# Patient Record
Sex: Female | Born: 1985 | Race: White | Hispanic: No | Marital: Single | Smoking: Former smoker
Health system: Southern US, Community
[De-identification: ages and names within clinical notes are randomized; demographics above are authoritative.]

## PROBLEM LIST (undated history)

## (undated) ENCOUNTER — Inpatient Hospital Stay (HOSPITAL_COMMUNITY): Payer: Self-pay

## (undated) ENCOUNTER — Inpatient Hospital Stay (HOSPITAL_COMMUNITY): Admission: RE | Payer: Medicaid Other | Source: Ambulatory Visit

## (undated) DIAGNOSIS — B999 Unspecified infectious disease: Secondary | ICD-10-CM

## (undated) DIAGNOSIS — I8390 Asymptomatic varicose veins of unspecified lower extremity: Secondary | ICD-10-CM

## (undated) DIAGNOSIS — D649 Anemia, unspecified: Secondary | ICD-10-CM

## (undated) DIAGNOSIS — IMO0002 Reserved for concepts with insufficient information to code with codable children: Secondary | ICD-10-CM

## (undated) DIAGNOSIS — B379 Candidiasis, unspecified: Secondary | ICD-10-CM

## (undated) DIAGNOSIS — Z8719 Personal history of other diseases of the digestive system: Secondary | ICD-10-CM

## (undated) DIAGNOSIS — Z87440 Personal history of urinary (tract) infections: Secondary | ICD-10-CM

## (undated) DIAGNOSIS — Z8619 Personal history of other infectious and parasitic diseases: Secondary | ICD-10-CM

## (undated) DIAGNOSIS — N159 Renal tubulo-interstitial disease, unspecified: Secondary | ICD-10-CM

## (undated) DIAGNOSIS — N189 Chronic kidney disease, unspecified: Secondary | ICD-10-CM

## (undated) HISTORY — DX: Anemia, unspecified: D64.9

## (undated) HISTORY — DX: Unspecified infectious disease: B99.9

## (undated) HISTORY — PX: NO PAST SURGERIES: SHX2092

## (undated) HISTORY — DX: Reserved for concepts with insufficient information to code with codable children: IMO0002

## (undated) HISTORY — DX: Candidiasis, unspecified: B37.9

## (undated) HISTORY — DX: Renal tubulo-interstitial disease, unspecified: N15.9

## (undated) HISTORY — DX: Personal history of other infectious and parasitic diseases: Z86.19

## (undated) HISTORY — DX: Chronic kidney disease, unspecified: N18.9

## (undated) HISTORY — DX: Asymptomatic varicose veins of unspecified lower extremity: I83.90

---

## 2006-09-24 DIAGNOSIS — I8390 Asymptomatic varicose veins of unspecified lower extremity: Secondary | ICD-10-CM

## 2006-09-24 HISTORY — DX: Asymptomatic varicose veins of unspecified lower extremity: I83.90

## 2007-09-25 DIAGNOSIS — IMO0002 Reserved for concepts with insufficient information to code with codable children: Secondary | ICD-10-CM

## 2007-09-25 DIAGNOSIS — B999 Unspecified infectious disease: Secondary | ICD-10-CM

## 2007-09-25 DIAGNOSIS — R87619 Unspecified abnormal cytological findings in specimens from cervix uteri: Secondary | ICD-10-CM

## 2007-09-25 HISTORY — DX: Reserved for concepts with insufficient information to code with codable children: IMO0002

## 2007-09-25 HISTORY — DX: Unspecified infectious disease: B99.9

## 2007-09-25 HISTORY — DX: Unspecified abnormal cytological findings in specimens from cervix uteri: R87.619

## 2007-10-02 ENCOUNTER — Inpatient Hospital Stay (HOSPITAL_COMMUNITY): Admission: RE | Admit: 2007-10-02 | Discharge: 2007-10-02 | Payer: Self-pay | Admitting: Family Medicine

## 2008-03-29 ENCOUNTER — Inpatient Hospital Stay (HOSPITAL_COMMUNITY): Admission: AD | Admit: 2008-03-29 | Discharge: 2008-03-29 | Payer: Self-pay | Admitting: Obstetrics and Gynecology

## 2008-04-10 ENCOUNTER — Inpatient Hospital Stay (HOSPITAL_COMMUNITY): Admission: AD | Admit: 2008-04-10 | Discharge: 2008-04-10 | Payer: Self-pay | Admitting: Obstetrics and Gynecology

## 2008-04-10 ENCOUNTER — Inpatient Hospital Stay (HOSPITAL_COMMUNITY): Admission: AD | Admit: 2008-04-10 | Discharge: 2008-04-12 | Payer: Self-pay | Admitting: Obstetrics and Gynecology

## 2008-05-25 DIAGNOSIS — IMO0002 Reserved for concepts with insufficient information to code with codable children: Secondary | ICD-10-CM

## 2008-05-25 DIAGNOSIS — R87612 Low grade squamous intraepithelial lesion on cytologic smear of cervix (LGSIL): Secondary | ICD-10-CM

## 2008-05-25 HISTORY — DX: Low grade squamous intraepithelial lesion on cytologic smear of cervix (LGSIL): R87.612

## 2008-05-25 HISTORY — DX: Reserved for concepts with insufficient information to code with codable children: IMO0002

## 2008-09-24 DIAGNOSIS — N189 Chronic kidney disease, unspecified: Secondary | ICD-10-CM

## 2008-09-24 HISTORY — DX: Chronic kidney disease, unspecified: N18.9

## 2009-03-28 ENCOUNTER — Emergency Department (HOSPITAL_COMMUNITY): Admission: EM | Admit: 2009-03-28 | Discharge: 2009-03-28 | Payer: Self-pay | Admitting: Emergency Medicine

## 2009-05-10 ENCOUNTER — Emergency Department (HOSPITAL_COMMUNITY): Admission: EM | Admit: 2009-05-10 | Discharge: 2009-05-10 | Payer: Self-pay | Admitting: Emergency Medicine

## 2009-07-26 ENCOUNTER — Emergency Department (HOSPITAL_COMMUNITY): Admission: EM | Admit: 2009-07-26 | Discharge: 2009-07-26 | Payer: Self-pay | Admitting: Emergency Medicine

## 2010-06-12 ENCOUNTER — Emergency Department (HOSPITAL_BASED_OUTPATIENT_CLINIC_OR_DEPARTMENT_OTHER): Admission: EM | Admit: 2010-06-12 | Discharge: 2010-06-12 | Payer: Self-pay | Admitting: Emergency Medicine

## 2010-06-12 ENCOUNTER — Ambulatory Visit: Payer: Self-pay | Admitting: Diagnostic Radiology

## 2010-12-07 LAB — URINALYSIS, ROUTINE W REFLEX MICROSCOPIC
Glucose, UA: NEGATIVE mg/dL
Nitrite: NEGATIVE
Specific Gravity, Urine: 1.026 (ref 1.005–1.030)
pH: 5.5 (ref 5.0–8.0)

## 2010-12-07 LAB — URINE MICROSCOPIC-ADD ON

## 2010-12-07 LAB — PREGNANCY, URINE: Preg Test, Ur: NEGATIVE

## 2010-12-30 LAB — URINALYSIS, ROUTINE W REFLEX MICROSCOPIC
Bilirubin Urine: NEGATIVE
Ketones, ur: NEGATIVE mg/dL
Nitrite: POSITIVE — AB
Protein, ur: NEGATIVE mg/dL
Urobilinogen, UA: 1 mg/dL (ref 0.0–1.0)

## 2010-12-30 LAB — URINE MICROSCOPIC-ADD ON

## 2010-12-30 LAB — URINE CULTURE: Colony Count: >=100000

## 2011-02-06 NOTE — H&P (Signed)
NAMESHAMEKA, AGGARWAL                ACCOUNT NO.:  [id number]   MEDICAL RECORD NO.:  [id number]          PATIENT TYPE:  INP   LOCATION:                                FACILITY:  [facility]   PHYSICIAN:  Janine Limbo, M.D.DATE OF BIRTH:  05-01-1986   DATE OF ADMISSION:  04/10/2008  DATE OF DISCHARGE:                              HISTORY & PHYSICAL   Ms. Brau is a 25 year old single white female gravida 3, para 2-0-0-2  at 40-0/7 weeks who presents with leaking clear fluid since 7:30 p.m.  with onset of regular uterine contractions since that time.  She denies  bleeding.  No signs or symptoms of PIH.  Her pregnancy has been followed  by the [facility] Certified Nurse Midwife Service has been  remarkable for,  1. Questionable last menstrual period.  2. Limited prenatal care.  3. Obesity.  4. History of oligohydramnios with previous pregnancy.  5. Son with arthrogryposis.  6. History of smoking.  7. First trimester spotting.  8. History of anemia.  9. Group B strep negative.   LABORATORY DATA:  Her prenatal labs were collected on December 23, 2007,  hemoglobin 12.5, hematocrit 38.1, and platelets 279,000.  Blood type A+,  antibody negative, sickle cell trait negative, RPR nonreactive, rubella  immune, hepatitis B surface antigen negative, and cystic fibrosis  negative.  Pap abnormal.  The 1-hour Glucola from January 09, 2008, was  118.  Culture of the vaginal tract for group B strep from March 12, 2008,  was negative.   HISTORY OF PRESENT PREGNANCY:  The patient presented for care at [facility] on December 23, 2007, at 24-3/[redacted] weeks gestation.  She had had a  previous visit at [facility], which was at 12 weeks' and then  was also seen in maternity admissions once for total of 2 visits in the  first and most of the second trimester.  Her EDC was established by 12-  week ultrasound to be April 10, 2008.  Anatomy ultrasound at 24-6/[redacted] weeks  gestation shows growth  consistent with previous ultrasound all anatomy  was seen.  The 1-hour Glucola at 27 weeks' gestation was within normal  limits.  The patient had a colposcopy also at 24 weeks with plan for  colpo and biopsy at 6 weeks postpartum.  The patient was given Macrobid  for UTI at 21 weeks' gestation.  She was given Macrobid again for  another urine culture at 32 weeks' gestation and then, the patient had  another UTI with Klebsiella at 34 weeks and was given prescription for  Keflex.  Rest of her prenatal care has been unremarkable.   OB HISTORY:  She is a gravida 3, para 2-0-0-2.  In June 2004, she had a  vaginal delivery of a female infant weighing 6 pounds and 5 ounces at [redacted]  weeks gestation.  Hours of labor is unknown.  She had no anesthesia.  Infant's name was Tacey Ruiz and there were no complications with that  pregnancy or birth.  In January 2008, she had a vaginal delivery of a  female  infant weighing 8 pounds 8 ounces at 40 weeks' gestation after 9  hours of labor.  She had an epidural for anesthesia.  Infant's name was  Vicente Serene and he was born with arthrogryposis and there was also  oligohydramnios in that pregnancy.  This third pregnancy is with a  different father of the baby.   PAST MEDICAL HISTORY:  She has no medication allergies.  She experienced  menarche at the age of 32 with irregular cycles lasting 4-5 days.  She  has used Depo-Provera and birth control pills for contraception.  She  stopped the birth control pills in August 2008.  She reports having had  the usual childhood illnesses.  The patient is a previous smoker.  Stopped in May 2007.   SURGICAL HISTORY:  Negative.   FAMILY MEDICAL HISTORY:  Father and maternal grandmother with chronic  hypertension.  Mother with varicosities.  Father and maternal  grandmother with diabetes.  Paternal grandfather with lung cancer.   GENETIC HISTORY:  Remarkable for the patient's second child born with  arthrogryposis, which is  contracted arms requiring physical therapy.  The patient's first child has sickle cell trait.   SOCIAL HISTORY:  The patient is single.  Father of the baby's name is  Caryn Bee.  He is involved and supportive.  They are both high school  educated.  The patient is a Optometrist.  Father of the baby  works full-time in Aeronautical engineer.  They deny any alcohol, tobacco, or  illicit drug use with the pregnancy.   OBJECTIVE:  VITAL SIGNS:  Stable.  Blood pressure is 130/81, she is  afebrile.  Fetal heart rate is reassuring.  Negative CST.  Contractions  were every 2-4 minutes.  Cervix is 5 cm, 80% vertex -2.  Clear fluid  that is positive to Nitrazine.  Positive for ferning.  EXTREMITIES:  Normal.   ASSESSMENT:  1. Intrauterine pregnancy at term.  2. Spontaneous rupture of membranes.  3. Early active labor.   PLAN:  1. To admit to birthing suites.  2. Routine CNM orders.  3. Plans epidural.  4. Anticipate normal spontaneous vaginal birth.      Cam Hai, C.N.M.      Janine Limbo, M.D.  Electronically Signed    KS/MEDQ  D:  04/10/2008  T:  04/11/2008  Job:  [id number]

## 2011-06-14 LAB — URINE CULTURE: Colony Count: >=100000

## 2011-06-14 LAB — WET PREP, GENITAL
Clue Cells Wet Prep HPF POC: NONE SEEN
Trich, Wet Prep: NONE SEEN
Yeast Wet Prep HPF POC: NONE SEEN

## 2011-06-14 LAB — URINALYSIS, ROUTINE W REFLEX MICROSCOPIC
Bilirubin Urine: NEGATIVE
Glucose, UA: NEGATIVE
Ketones, ur: 15 — AB
Leukocytes, UA: NEGATIVE
Nitrite: POSITIVE — AB
Protein, ur: NEGATIVE
Specific Gravity, Urine: 1.025
Urobilinogen, UA: 0.2
pH: 6

## 2011-06-14 LAB — URINE MICROSCOPIC-ADD ON

## 2011-06-22 LAB — URINALYSIS, ROUTINE W REFLEX MICROSCOPIC
Bilirubin Urine: NEGATIVE
Ketones, ur: NEGATIVE
Nitrite: NEGATIVE
Urobilinogen, UA: 0.2

## 2011-06-22 LAB — CBC
HCT: 31.2 — ABNORMAL LOW
HCT: 35.5 — ABNORMAL LOW
Hemoglobin: 11.9 — ABNORMAL LOW
MCHC: 33.4
MCV: 84.2
Platelets: 221
RDW: 14
RDW: 14.4

## 2011-09-25 NOTE — L&D Delivery Note (Signed)
Delivery Note At 3:56 PM a viable female, "Madison Mays", was delivered via Vaginal, Spontaneous Delivery (Presentation: ;  ).  APGAR: 9, 9; weight .   Placenta status: Intact, Spontaneous.  Cord: 3 vessels with the following complications: CAN x 1--delivered through cord, and around body x 1.  Cord pH: NA  Anesthesia: Epidural  Episiotomy: None Lacerations: None--2 small abrasions (1 right periurethral and 1 at introitus--hemostatic, no sutures needed). Suture Repair: None Est. Blood Loss (mL): 150  Mom to postpartum.  Baby to skin to skin. Patient desires BTL--consent signed 07/29/12. Will plan for tomorrow--NPO after MN, maintain epidural cath and IV access.  Nigel Bridgeman 09/21/2012, 4:31 PM

## 2012-02-27 ENCOUNTER — Encounter (HOSPITAL_COMMUNITY): Payer: Self-pay | Admitting: *Deleted

## 2012-02-27 ENCOUNTER — Ambulatory Visit (HOSPITAL_COMMUNITY): Payer: Self-pay

## 2012-02-27 ENCOUNTER — Inpatient Hospital Stay (HOSPITAL_COMMUNITY)
Admission: AD | Admit: 2012-02-27 | Discharge: 2012-02-27 | Disposition: A | Discharge status: Home / Self Care | Payer: Medicaid Other | Source: Ambulatory Visit | Attending: Obstetrics and Gynecology | Admitting: Obstetrics and Gynecology

## 2012-02-27 ENCOUNTER — Inpatient Hospital Stay (HOSPITAL_COMMUNITY): Payer: Medicaid Other

## 2012-02-27 DIAGNOSIS — O2340 Unspecified infection of urinary tract in pregnancy, unspecified trimester: Secondary | ICD-10-CM

## 2012-02-27 DIAGNOSIS — Z30432 Encounter for removal of intrauterine contraceptive device: Secondary | ICD-10-CM

## 2012-02-27 DIAGNOSIS — O9989 Other specified diseases and conditions complicating pregnancy, childbirth and the puerperium: Secondary | ICD-10-CM

## 2012-02-27 DIAGNOSIS — O210 Mild hyperemesis gravidarum: Secondary | ICD-10-CM | POA: Insufficient documentation

## 2012-02-27 DIAGNOSIS — O209 Hemorrhage in early pregnancy, unspecified: Secondary | ICD-10-CM

## 2012-02-27 DIAGNOSIS — O2390 Unspecified genitourinary tract infection in pregnancy, unspecified trimester: Secondary | ICD-10-CM

## 2012-02-27 DIAGNOSIS — O2630 Retained intrauterine contraceptive device in pregnancy, unspecified trimester: Secondary | ICD-10-CM

## 2012-02-27 DIAGNOSIS — N390 Urinary tract infection, site not specified: Secondary | ICD-10-CM

## 2012-02-27 LAB — CBC
Hemoglobin: 13.1 g/dL (ref 12.0–15.0)
MCH: 29.1 pg (ref 26.0–34.0)
MCV: 85.6 fL (ref 78.0–100.0)
RBC: 4.5 MIL/uL (ref 3.87–5.11)

## 2012-02-27 LAB — URINALYSIS, ROUTINE W REFLEX MICROSCOPIC
Bilirubin Urine: NEGATIVE
Ketones, ur: NEGATIVE mg/dL
Nitrite: POSITIVE — AB
Urobilinogen, UA: 0.2 mg/dL (ref 0.0–1.0)
pH: 6.5 (ref 5.0–8.0)

## 2012-02-27 LAB — WET PREP, GENITAL
Trich, Wet Prep: NONE SEEN
Yeast Wet Prep HPF POC: NONE SEEN

## 2012-02-27 LAB — ABO/RH: ABO/RH(D): A POS

## 2012-02-27 MED ORDER — NITROFURANTOIN MONOHYD MACRO 100 MG PO CAPS: 100.0000 mg | ORAL_CAPSULE | Freq: Two times a day (BID) | ORAL | Status: AC

## 2012-02-27 NOTE — Discharge Instructions (Signed)
    ________________________________________     To schedule your Maternity Eligibility Appointment, please call [phone].  When you arrive for your appointment you must bring the following items or information listed below.  Your appointment will be rescheduled if you do not have these items or are 15 minutes late. If currently receiving Medicaid, you MUST bring: 1. Medicaid Card 2. Social Security Card 3. Picture ID 4. Proof of Pregnancy 5. Verification of current address if the address on Medicaid card is incorrect "postmarked mail" If not receiving Medicaid, you MUST bring: 1. Social Security Card 2. Picture ID 3. Birth Certificate (if available) Passport or *Green Card 4. Proof of Pregnancy 5. Verification of current address "postmarked mail" for each income presented. 6. Verification of insurance coverage, if any 7. Check stubs from each employer for the previous month (if unable to present check stub  for each week, we will accept check stub for the first and last week ill the same month.) If you can't locate check stubs, you must bring a letter from the employer(s) and it must have the following information on letterhead, typed, in English: o name of company o company telephone number o how long been with the company, if less than one month o how much person earns per hour o how many hours per week work o the gross pay the person earned for the previous month If you are 26 years old or less, you do not have to bring proof of income unless you work or live with the father of the baby and at that time we will need proof of income from you and/or the father of the baby. Green Card recipients are eligible for Medicaid for Pregnant Women (MPW)    

## 2012-02-27 NOTE — MAU Note (Signed)
Pt in c/o positive UPT today, lmp was march 17th, but states they have been irregular since IUD placed in august 2009.  Denies any pain or bleeding.  Vomiting x1 episode this morning.

## 2012-02-27 NOTE — MAU Note (Signed)
Pt states had +upt at home, has IUD that was placed in August 2009. No menstrual cycle since March, however she normally has irregular menstrual cycles. With other pregnancies, pt states she would vomit yellow bile in the mornings, and this occurred this am. Denies pain at present. Denies abnormal vaginal d/c changes.

## 2012-02-27 NOTE — MAU Provider Note (Signed)
History     CSN: [id number]  Arrival date and time: 02/27/12 1529      Chief Complaint  Patient presents with  . Positive upt, has IUD    HPI  Madison Mays 25 y.o. presents today for positive pregnancy test taken this am following an episode of n/v x 1.  Patient states she vomited like she has done in past with pregnancy which is why she took the test.  Has a history of Mirena IUD placement by [facility] in 04/2008.  Has not returned or followed up for routine care since then.  Been "a long time" since she's checked for strings.  She states last episode of bleeding was on March 17th or 18th and lasted 5 days.  Was not concerned as her menses are typically very irregular.     OB History    Grav Para Term Preterm Abortions TAB SAB Ect Mult Living   3 3 3  0 0 0 0 0 0 3      Past Medical History  Diagnosis Date  . No pertinent past medical history     Past Surgical History  Procedure Date  . No past surgeries     Family History  Problem Relation Age of Onset  . Anesthesia problems Neg Hx     History  Substance Use Topics  . Smoking status: Never Smoker   . Smokeless tobacco: Not on file  . Alcohol Use: No    Allergies: No Known Allergies  Prescriptions prior to admission  Medication Sig Dispense Refill  . ibuprofen (ADVIL,MOTRIN) 200 MG tablet Take 200 mg by mouth every 6 (six) hours as needed. headache        Review of Systems  Constitutional: Negative.   HENT: Negative.   Eyes: Negative.   Respiratory: Negative.   Cardiovascular: Negative.   Gastrointestinal: Negative.   Genitourinary: Negative.        See HPI.  Musculoskeletal: Negative.   Skin: Negative.   Neurological: Negative.   Endo/Heme/Allergies: Negative.   Psychiatric/Behavioral: Negative.    Physical Exam   Blood pressure 129/79, pulse 104, temperature 98.8 F (37.1 C), temperature source Oral, resp. rate 16, height 5\' 4"  (1.626 m), weight 113.002 kg (249 lb 2 oz), SpO2 100.00%.  Physical  Exam  Constitutional: She is oriented to person, place, and time. She appears well-developed and well-nourished.  HENT:  Head: Normocephalic and atraumatic.  Cardiovascular: Normal rate, regular rhythm, normal heart sounds and intact distal pulses.  Exam reveals no gallop and no friction rub.   No murmur heard. Respiratory: Effort normal and breath sounds normal. No respiratory distress.  GI: Soft. Bowel sounds are normal. She exhibits no mass. There is no tenderness. There is no rebound and no guarding.  Neurological: She is alert and oriented to person, place, and time.  Skin: Skin is warm and dry.  Psychiatric: She has a normal mood and affect. Her behavior is normal.   Speculum exam: Vagina - Small amount of clear discharge, no odor. Cervix - No contact bleeding, IUD strings visualized. Bimanual exam: Cervix closed, long and thick. No CMT. Uterus non tender, unable to complete bimanual examination for uterine size due to body habitus. Adnexa non tender, no masses bilaterally. GC/Chlam, wet prep done. Chaperone present for exam.   MAU Course  Procedures  Results for orders placed during the hospital encounter of 02/27/12 (from the past 24 hour(s))  URINALYSIS, ROUTINE W REFLEX MICROSCOPIC     Status: Abnormal   Collection  Time   02/27/12  3:57 PM      Component Value Range   Color, Urine YELLOW  YELLOW    APPearance HAZY (*) CLEAR    Specific Gravity, Urine 1.020  1.005 - 1.030    pH 6.5  5.0 - 8.0    Glucose, UA NEGATIVE  NEGATIVE (mg/dL)   Hgb urine dipstick TRACE (*) NEGATIVE    Bilirubin Urine NEGATIVE  NEGATIVE    Ketones, ur NEGATIVE  NEGATIVE (mg/dL)   Protein, ur NEGATIVE  NEGATIVE (mg/dL)   Urobilinogen, UA 0.2  0.0 - 1.0 (mg/dL)   Nitrite POSITIVE (*) NEGATIVE    Leukocytes, UA NEGATIVE  NEGATIVE   URINE MICROSCOPIC-ADD ON     Status: Abnormal   Collection Time   02/27/12  3:57 PM      Component Value Range   Squamous Epithelial / LPF MANY (*) RARE    WBC, UA  0-2  <3 (WBC/hpf)   Bacteria, UA MANY (*) RARE   POCT PREGNANCY, URINE     Status: Abnormal   Collection Time   02/27/12  4:19 PM      Component Value Range   Preg Test, Ur POSITIVE (*) NEGATIVE    OBSTETRIC <14 WK ULTRASOUND  Technique: Transabdominal ultrasound was performed for evaluation  of the gestation as well as the maternal uterus and adnexal  regions.  Findings: The clinical gestational age by last menstrual period is  11 weeks and 3 days.  There is a single intrauterine gestational sac. An embryo is  visualized with a crown-rump length of 5.35 cm. This corresponds  to a 12-week-1-day gestation. The fetal heart rate is equal to 165  beats per minute.  Maternal uterus/adnexae:  No subchorionic hemorrhage.  The ovaries both appear normal.  No free fluid within the pelvis.  IMPRESSION:  1. Single living intrauterine gestation with an estimated  gestational age of [redacted] weeks and 1 day. The gestational age by last  menstrual period is 11 weeks and 3 days.  Original Report Authenticated By: Rosealee Albee, M.D.       Pamelia Hoit NP consulted with Dr. Jolayne Panther regarding removal of IUD.  If strings visualized during speculum exam, to be removed. IUD removed without difficulty with Kelly forceps.-no bleeding or cramping noted  Assessment and Plan   Assessment: Pregnancy with IUD IUD Removal UTI Single living [redacted]w[redacted]d IUP  Plan:  Macrobid BID for 7 days. Follow up with Primary for Prenatal care.  Agree with the above and supervised NP student Pamelia Hoit, RNC/[facility]NP  Servando Salina 02/27/2012, 4:30 PM

## 2012-02-28 NOTE — MAU Provider Note (Signed)
Agree with above note.  Sherrina Zaugg 02/28/2012 1:24 PM   

## 2012-02-29 LAB — URINE CULTURE: Colony Count: >=100000

## 2012-03-02 ENCOUNTER — Other Ambulatory Visit: Payer: Self-pay | Admitting: Advanced Practice Midwife

## 2012-03-02 DIAGNOSIS — O2340 Unspecified infection of urinary tract in pregnancy, unspecified trimester: Secondary | ICD-10-CM | POA: Insufficient documentation

## 2012-03-02 MED ORDER — CEPHALEXIN 500 MG PO CAPS: 500.0000 mg | ORAL_CAPSULE | Freq: Four times a day (QID) | ORAL | Status: AC

## 2012-03-02 NOTE — Progress Notes (Signed)
UTI resistant to Macrobid. Rx Keflex.

## 2012-03-03 NOTE — Progress Notes (Signed)
Called patient and left a message to return our call

## 2012-03-05 NOTE — Progress Notes (Signed)
Called pt and pt informed me that she has already picked up her Rx for Keflex.  Pt had no further questions.

## 2012-03-22 ENCOUNTER — Inpatient Hospital Stay (HOSPITAL_COMMUNITY)
Admission: AD | Admit: 2012-03-22 | Discharge: 2012-03-22 | Disposition: A | Discharge status: Home / Self Care | Payer: Medicaid Other | Source: Ambulatory Visit | Attending: Obstetrics and Gynecology | Admitting: Obstetrics and Gynecology

## 2012-03-22 ENCOUNTER — Encounter (HOSPITAL_COMMUNITY): Payer: Self-pay

## 2012-03-22 DIAGNOSIS — M545 Low back pain, unspecified: Secondary | ICD-10-CM | POA: Insufficient documentation

## 2012-03-22 DIAGNOSIS — M549 Dorsalgia, unspecified: Secondary | ICD-10-CM

## 2012-03-22 DIAGNOSIS — O210 Mild hyperemesis gravidarum: Secondary | ICD-10-CM | POA: Insufficient documentation

## 2012-03-22 DIAGNOSIS — O368190 Decreased fetal movements, unspecified trimester, not applicable or unspecified: Secondary | ICD-10-CM | POA: Insufficient documentation

## 2012-03-22 HISTORY — DX: Personal history of urinary (tract) infections: Z87.440

## 2012-03-22 LAB — URINE MICROSCOPIC-ADD ON

## 2012-03-22 LAB — URINALYSIS, ROUTINE W REFLEX MICROSCOPIC
Glucose, UA: NEGATIVE mg/dL
Ketones, ur: NEGATIVE mg/dL
Leukocytes, UA: NEGATIVE
Nitrite: NEGATIVE
Protein, ur: NEGATIVE mg/dL

## 2012-03-22 MED ORDER — CYCLOBENZAPRINE HCL 10 MG PO TABS: 10.0000 mg | ORAL_TABLET | Freq: Two times a day (BID) | ORAL | Status: AC | PRN

## 2012-03-22 MED ORDER — PROMETHAZINE HCL 25 MG PO TABS: 25.0000 mg | ORAL_TABLET | Freq: Four times a day (QID) | ORAL | Status: DC | PRN

## 2012-03-22 NOTE — MAU Note (Signed)
Right flank pain since Friday morning. History of frequent bladder infections, last one treated earlier in June. Denies vaginal bleeding or discharge. Lower abdominal cramping x2 days. Nausea/vomiting worse today.

## 2012-03-22 NOTE — MAU Provider Note (Signed)
History     CSN: [id number]  Arrival date and time: 03/22/12 0321   First Provider Initiated Contact with Patient 03/22/12 [phone]      No chief complaint on file.  HPI  Pt is [redacted]w[redacted]d pregnant and complains of right lower back pain and "decrease in fetal movement".  Pt has not established prenatal care yet- waiting on Medicaid. Pt was treated for UTI on 02/27/2012 with Macrobid, which urine culture showed resistant and pt was switched to Keflex, which pt is taking.  Pt denies UTI symptoms now.  Pt has had nausea on and off with occ vomiting.    Past Medical History  Diagnosis Date  . H/O bladder infections     Past Surgical History  Procedure Date  . No past surgeries     Family History  Problem Relation Age of Onset  . Anesthesia problems Neg Hx     History  Substance Use Topics  . Smoking status: Never Smoker   . Smokeless tobacco: Not on file  . Alcohol Use: No    Allergies: No Known Allergies  Prescriptions prior to admission  Medication Sig Dispense Refill  . cephALEXin (KEFLEX) 250 MG capsule Take 250 mg by mouth 4 (four) times daily.        ROS Physical Exam   Blood pressure 118/62, pulse 101, temperature 98.5 F (36.9 C), temperature source Oral, resp. rate 16, height 5\' 4"  (1.626 m), weight 249 lb 12.8 oz (113.309 kg).  Physical Exam  Vitals reviewed. Constitutional: She is oriented to person, place, and time. She appears well-developed and well-nourished.  HENT:  Head: Normocephalic.  Eyes: Pupils are equal, round, and reactive to light.  Neck: Normal range of motion. Neck supple.  Cardiovascular: Normal rate.   Respiratory: Effort normal.  GI: Soft. She exhibits no distension. There is no tenderness. There is no rebound and no guarding.       No CVA tenderness; FHT obtained with doppler; pt points to right lower back above hip pain  Musculoskeletal: Normal range of motion.  Neurological: She is alert and oriented to person, place, and time.  Skin:  Skin is warm and dry.  Psychiatric: She has a normal mood and affect.    MAU Course  Procedures Results for orders placed during the hospital encounter of 03/22/12 (from the past 24 hour(s))  URINALYSIS, ROUTINE W REFLEX MICROSCOPIC     Status: Abnormal   Collection Time   03/22/12  3:30 AM      Component Value Range   Color, Urine YELLOW  YELLOW   APPearance CLEAR  CLEAR   Specific Gravity, Urine >1.030 (*) 1.005 - 1.030   pH 6.0  5.0 - 8.0   Glucose, UA NEGATIVE  NEGATIVE mg/dL   Hgb urine dipstick SMALL (*) NEGATIVE   Bilirubin Urine NEGATIVE  NEGATIVE   Ketones, ur NEGATIVE  NEGATIVE mg/dL   Protein, ur NEGATIVE  NEGATIVE mg/dL   Urobilinogen, UA 0.2  0.0 - 1.0 mg/dL   Nitrite NEGATIVE  NEGATIVE   Leukocytes, UA NEGATIVE  NEGATIVE  URINE MICROSCOPIC-ADD ON     Status: Abnormal   Collection Time   03/22/12  3:30 AM      Component Value Range   Squamous Epithelial / LPF FEW (*) RARE   WBC, UA 0-2  <3 WBC/hpf   RBC / HPF 3-6  <3 RBC/hpf   Bacteria, UA FEW (*) RARE      Assessment and Plan  Back pain in pregnancy- Tylenol  and prescription for Flexeril Return if increase in pain, nausea or fever Morning sickness- prescription for phenergan Start prenatal care   [facility] 03/22/2012, 3:57 AM

## 2012-03-29 NOTE — MAU Provider Note (Signed)
Attestation of Attending Supervision of Advanced Practitioner: Evaluation and management procedures were performed by the PA/NP/CNM/OB Fellow under my supervision/collaboration. Chart reviewed and agree with management and plan.  Akiba Melfi V 03/29/2012 5:56 AM

## 2012-04-29 ENCOUNTER — Ambulatory Visit (INDEPENDENT_AMBULATORY_CARE_PROVIDER_SITE_OTHER): Payer: Medicaid Other | Admitting: Obstetrics and Gynecology

## 2012-04-29 DIAGNOSIS — Z331 Pregnant state, incidental: Secondary | ICD-10-CM

## 2012-04-29 LAB — POCT URINALYSIS DIPSTICK
Bilirubin, UA: NEGATIVE
Ketones, UA: NEGATIVE
Spec Grav, UA: 1.025
pH, UA: 5

## 2012-04-29 NOTE — Progress Notes (Signed)
OCC EXPOSURE TO JET FUEL FUMES; PT BECAME PREGNANT WITH IUD.  REMOVED 02/2012.  RESTRICTION LETTER GIVEN FOR WORK. PT DECLINES QUAD SCREEN  TODAY BUT IS CONSIDERING

## 2012-04-30 LAB — PRENATAL PANEL VII
Basophils Relative: 0 % (ref 0–1)
HCT: 36.8 % (ref 36.0–46.0)
HIV: NONREACTIVE
Hemoglobin: 12.4 g/dL (ref 12.0–15.0)
MCHC: 33.7 g/dL (ref 30.0–36.0)
Monocytes Absolute: 0.6 10*3/uL (ref 0.1–1.0)
Monocytes Relative: 7 % (ref 3–12)
Neutro Abs: 5.5 10*3/uL (ref 1.7–7.7)
Rh Type: POSITIVE
Rubella: 11.9 IU/mL

## 2012-04-30 LAB — CULTURE, OB URINE: Organism ID, Bacteria: NO GROWTH

## 2012-05-05 ENCOUNTER — Encounter: Payer: Medicaid Other | Admitting: Obstetrics and Gynecology

## 2012-05-05 ENCOUNTER — Ambulatory Visit (INDEPENDENT_AMBULATORY_CARE_PROVIDER_SITE_OTHER): Payer: Medicaid Other | Admitting: Obstetrics and Gynecology

## 2012-05-05 ENCOUNTER — Encounter: Payer: Self-pay | Admitting: Obstetrics and Gynecology

## 2012-05-05 VITALS — BP 100/62 | Wt 249.0 lb

## 2012-05-05 DIAGNOSIS — Z331 Pregnant state, incidental: Secondary | ICD-10-CM

## 2012-05-05 DIAGNOSIS — Z3689 Encounter for other specified antenatal screening: Secondary | ICD-10-CM

## 2012-05-05 DIAGNOSIS — Z3490 Encounter for supervision of normal pregnancy, unspecified, unspecified trimester: Secondary | ICD-10-CM

## 2012-05-05 DIAGNOSIS — R110 Nausea: Secondary | ICD-10-CM

## 2012-05-05 DIAGNOSIS — Z1379 Encounter for other screening for genetic and chromosomal anomalies: Secondary | ICD-10-CM

## 2012-05-05 MED ORDER — ONDANSETRON 4 MG PO TBDP: 4.0000 mg | ORAL_TABLET | Freq: Three times a day (TID) | ORAL | Status: AC | PRN

## 2012-05-05 NOTE — Patient Instructions (Addendum)
Sterilization, Women Sterilization is a surgical procedure. This surgery permanently prevents pregnancy in women. This can be done by tying (with or without cutting) the fallopian tubes or burning the tubes closed (tubal ligation). Tubal ligation blocks the tubes and prevents the egg from being fertilized by the sperm. Sterilization can be done by removing the ovaries that produce the egg (castration) as well. Sterilization is considered safe with very rare complications. It does not affect menstrual periods, sexual desire, or performance.  Since sterilization is considered permanent, you should not do it until you are sure you do not want to have more children. You and your partner should fully agree to have the procedure. Your decision to have the procedure should not be made when you are in a stressful situation. This can include a loss of a pregnancy, illness or death of a spouse, or divorce. There are other means of preventing unwanted pregnancies that can be used until you are completely sure you want to be sterilized. Sterilization does not protect against sexually transmitted disease. Women who had a sterilization procedure and want it reversed must know that it requires an expensive and major operation. The reversal may not be successful and has a high rate of tubal (ectopic) pregnancy that can be dangerous and require surgery. There are several ways to perform a tubal sterlization:  Laparoscopy. The abdomen is filled with a gas to see the pelvic organs. Then, a tube with a light attached is inserted into the abdomen through 2 small incisions. The fallopian tubes are blocked with a ring, clip or electrocautery to burn closed the tubes. Then, the gas is released and the small incisions are closed.   Hysteroscopy. A tube with a light is inserted in the vagina, through the cervix and then into the uterus. A spring-like instrument is inserted into the opening of the fallopian tubes. The spring causes  scaring and blocks the tubes. Other forms of contraception should be used for three months at which time an X-ray is done to be sure the tubes are blocked.   Minilaparotomy. This is done right after giving birth. A small incision is made under the belly button and the tubes are exposed. The tubes can then be burned, tied and/or cut.   Tubal ligation can be done during a Cesarean section.   Castration is a surgical procedure that removes both ovaries.  Tubal sterilization should be discussed with your caregiver to answer any concerns you or your partner might have. This meeting will help to decide for sure if the operation is safe for you and which procedure is the best one for you. You can change your mind and cancel the surgery at any time. HOME CARE INSTRUCTIONS   Follow your caregivers instructions regarding diet, rest, work, social and sexual activities and follow up appointments.   Shoulder pain is common following a laparoscopy. The pain may be relieved by lying down flat.   Only take over-the-counter or prescription medicines for pain, discomfort or fever as directed by your caregiver.   You may use lozenges for throat discomfort.   Keep the incisions covered to prevent infection.  SEEK IMMEDIATE MEDICAL CARE IF:   You develop a temperature of 102 F (38.9 C), or as your caregiver suggests.   You become dizzy or faint.   You start to feel sick to your stomach (nausea) or throw up (vomit).   You develop abdominal pain not relieved with over-the-counter medications.   You have redness and puffiness (  swelling) of the cut (incision).   You see pus draining from the incision.   You miss a menstrual period.  Document Released: 02/27/2008 Document Revised: 08/30/2011 Document Reviewed: 02/27/2008 [facility] Patient Information 2012 [place], [place].

## 2012-05-05 NOTE — Progress Notes (Signed)
Pt wants genetic screening  Pap due today GC/CT done 02/27/12 WNL

## 2012-05-05 NOTE — Progress Notes (Signed)
[redacted]w[redacted]d Subjective:    Madison Mays is being seen today for her first obstetrical visit at [redacted]w[redacted]d gestation by USS with EDD 09/14/12.  She reports no complains today but has days with nausea.   Her obstetrical history is significant for: Patient Active Problem List  Diagnosis  . UTI (urinary tract infection) during pregnancy    Resolved with Macrobid 100 mgs po x 7 days.  Relationship with FOB: involved and supportive.  Patient does intend to breast feed.   Pregnancy history fully reviewed. Has had 3 SVDs at term with no complications.    Review of Systems Pertinent ROS is described in HPI   Objective:   BP 100/62  Wt 249 lb (112.946 kg) Wt Readings from Last 1 Encounters:  05/05/12 249 lb (112.946 kg)   BMI: 41.71 - early Glucola recommended.  General: alert, cooperative and no distress Respiratory: clear to auscultation bilaterally Cardiovascular: regular rate and rhythm, S1, S2 normal, no murmur Breasts:  No dominant masses, nipples erect Gastrointestinal: soft, non-tender; no masses,  no organomegaly Extremities: extremities normal, no pain or edema Vaginal Bleeding: None  EXTERNAL GENITALIA: normal appearing vulva with no masses, tenderness or lesions VAGINA: no abnormal discharge or lesions CERVIX: no lesions or cervical motion tenderness; cervix closed, long, firm UTERUS: gravid and consistent with 21 weeks ADNEXA: no masses palpable and nontender OB EXAM PELVIMETRY: appears adequate   FHR:  135  bpm  Assessment:    Pregnancy at [redacted]w[redacted]d    Plan:     Prenatal panel reviewed and discussed with the patient:yes Pap smear collected:yes GC/Chlamydia collected:yes Wet prep:  Negative. Discussion of Genetic testing options: AFP and Quad screen with 1 hr Gtt this week Prenatal vitamins recommended Problem list reviewed and updated.  Plan of care: Follow up in 2 days for 1hr Gtt and Genetic screening. Anatomy USS this week. - scheduled. Desires BTL -  will sign papers next visit.  Earl Gala CNM, [place] 05/05/2012 10:53 AM

## 2012-05-07 ENCOUNTER — Ambulatory Visit (INDEPENDENT_AMBULATORY_CARE_PROVIDER_SITE_OTHER): Payer: Medicaid Other

## 2012-05-07 ENCOUNTER — Encounter: Payer: Self-pay | Admitting: Obstetrics and Gynecology

## 2012-05-07 ENCOUNTER — Ambulatory Visit (INDEPENDENT_AMBULATORY_CARE_PROVIDER_SITE_OTHER): Payer: Medicaid Other | Admitting: Obstetrics and Gynecology

## 2012-05-07 VITALS — BP 110/60 | Wt 248.0 lb

## 2012-05-07 DIAGNOSIS — Z1379 Encounter for other screening for genetic and chromosomal anomalies: Secondary | ICD-10-CM

## 2012-05-07 DIAGNOSIS — Z331 Pregnant state, incidental: Secondary | ICD-10-CM

## 2012-05-07 DIAGNOSIS — R638 Other symptoms and signs concerning food and fluid intake: Secondary | ICD-10-CM

## 2012-05-07 DIAGNOSIS — Z3490 Encounter for supervision of normal pregnancy, unspecified, unspecified trimester: Secondary | ICD-10-CM

## 2012-05-07 DIAGNOSIS — Z3689 Encounter for other specified antenatal screening: Secondary | ICD-10-CM

## 2012-05-07 LAB — PAP IG AND HPV HIGH-RISK

## 2012-05-07 LAB — HEMOGLOBIN: Hemoglobin: 12.5 g/dL (ref 12.0–15.0)

## 2012-05-07 LAB — US OB COMP + 14 WK

## 2012-05-07 NOTE — Progress Notes (Signed)
102w3d Patient has been seen earlier in the week for NOB visit and had scheduled her for Anatomy USS today with 1 hr GTT. USS result: breech Presentation, Anterior Placenta, Fluid is normal. Anatomy normal. Female Gender. Normal ovaries, No fluid seen in CDS, Normal Adnexas. Hypo-coiled umbilical cord noted.  To re eval in 4 weeks. No problems.

## 2012-05-07 NOTE — Progress Notes (Signed)
1 GTT given today without difficulty   AFP today

## 2012-05-08 ENCOUNTER — Encounter: Payer: Self-pay | Admitting: Obstetrics and Gynecology

## 2012-05-08 LAB — GLUCOSE TOLERANCE, 1 HOUR (50G) W/O FASTING: Glucose, 1 Hour GTT: 70 mg/dL (ref 70–140)

## 2012-05-12 LAB — AFP, QUAD SCREEN
Curr Gest Age: 21.3 wks.days
Down Syndrome Scr Risk Est: <1:38500 {titer}
INH: 66.4 pg/mL
MoM for INH: 0.44
MoM for hCG: 0.2
Osb Risk: 1:3060 {titer}
Tri 18 Scr Risk Est: NEGATIVE
Trisomy 18 (Edward) Syndrome Interp.: 1:1280 {titer}
uE3 Mom: 1.09

## 2012-06-04 ENCOUNTER — Ambulatory Visit (INDEPENDENT_AMBULATORY_CARE_PROVIDER_SITE_OTHER): Payer: Medicaid Other | Admitting: Obstetrics and Gynecology

## 2012-06-04 ENCOUNTER — Telehealth: Payer: Self-pay | Admitting: Obstetrics and Gynecology

## 2012-06-04 ENCOUNTER — Encounter: Payer: Self-pay | Admitting: Obstetrics and Gynecology

## 2012-06-04 DIAGNOSIS — IMO0001 Reserved for inherently not codable concepts without codable children: Secondary | ICD-10-CM

## 2012-06-04 MED ORDER — ONDANSETRON 4 MG PO TBDP: 4.0000 mg | ORAL_TABLET | Freq: Three times a day (TID) | ORAL | Status: AC | PRN

## 2012-06-04 NOTE — Progress Notes (Signed)
Children have been sick with virus--patient with vomiting this am, but has struggled with that entire pregnancy. Declined phenergan due to sedation--Rx Zofran ODT today. Will call if sx worsen. Plan [place] NV for growth, fluid due to hypercoiled cord. Glucola NV, with RPR and Hgb

## 2012-06-04 NOTE — Telephone Encounter (Signed)
She said she didn't want Phenergen due to sleepiness--but that would be fine. Phenergan 25 mg po q 6 hours prn, #36, 2 refills.  VL

## 2012-06-04 NOTE — Telephone Encounter (Signed)
Tc to pt per vl recs. Pt states,"already has Phenergan at home and will get rx for Zofran". Pt agrees.

## 2012-06-04 NOTE — Telephone Encounter (Signed)
VL to addresss. Pt seen today

## 2012-06-04 NOTE — Progress Notes (Signed)
[redacted]w[redacted]d Pt c/o having stomach virus over the last couple days.

## 2012-06-25 ENCOUNTER — Ambulatory Visit (INDEPENDENT_AMBULATORY_CARE_PROVIDER_SITE_OTHER): Payer: Medicaid Other | Admitting: Obstetrics and Gynecology

## 2012-06-25 ENCOUNTER — Encounter: Payer: Self-pay | Admitting: Obstetrics and Gynecology

## 2012-06-25 ENCOUNTER — Ambulatory Visit (INDEPENDENT_AMBULATORY_CARE_PROVIDER_SITE_OTHER): Payer: Medicaid Other

## 2012-06-25 VITALS — BP 100/68 | Wt 244.0 lb

## 2012-06-25 DIAGNOSIS — Z9851 Tubal ligation status: Secondary | ICD-10-CM

## 2012-06-25 DIAGNOSIS — IMO0001 Reserved for inherently not codable concepts without codable children: Secondary | ICD-10-CM

## 2012-06-25 LAB — US OB FOLLOW UP

## 2012-06-25 NOTE — Progress Notes (Signed)
[redacted]w[redacted]d Growth u/s today hypercoiled cord & EFW EFW 3lb2oz cx 5.18 cm AFI 75th% Anterior placenta 1 gtt 70 Hemoglobin 12.5 RPR NRF C/o heartburn causing nausea

## 2012-06-25 NOTE — Progress Notes (Signed)
Has nausea, but feels it's coming from reflux. Will try Pepcid Complete (or store brand of same). Wants BTL--R&B reviewed.  Will sign consent today. US WNL today--repeat q 4 weeks for growth and fluid due to hypercoiled cord.

## 2012-07-09 ENCOUNTER — Other Ambulatory Visit: Payer: Medicaid Other

## 2012-07-09 ENCOUNTER — Encounter: Payer: Medicaid Other | Admitting: Obstetrics and Gynecology

## 2012-07-16 ENCOUNTER — Ambulatory Visit (INDEPENDENT_AMBULATORY_CARE_PROVIDER_SITE_OTHER): Payer: Medicaid Other | Admitting: Obstetrics and Gynecology

## 2012-07-16 ENCOUNTER — Other Ambulatory Visit: Payer: Medicaid Other

## 2012-07-16 VITALS — BP 116/64 | Wt 244.0 lb

## 2012-07-16 DIAGNOSIS — Z331 Pregnant state, incidental: Secondary | ICD-10-CM

## 2012-07-16 DIAGNOSIS — IMO0001 Reserved for inherently not codable concepts without codable children: Secondary | ICD-10-CM

## 2012-07-16 LAB — CBC
MCHC: 33.2 g/dL (ref 30.0–36.0)
MCV: 86.2 fL (ref 78.0–100.0)
Platelets: 298 10*3/uL (ref 150–400)
RDW: 13.3 % (ref 11.5–15.5)
WBC: 10.1 10*3/uL (ref 4.0–10.5)

## 2012-07-16 NOTE — Progress Notes (Signed)
[redacted]w[redacted]d Glucola today Hypocoiled umbilical cord: growth ultrasound at next visit

## 2012-07-16 NOTE — Progress Notes (Signed)
[redacted]w[redacted]d No complaints today.  glucola given today @10 :12 am.  ld

## 2012-07-29 ENCOUNTER — Ambulatory Visit (INDEPENDENT_AMBULATORY_CARE_PROVIDER_SITE_OTHER): Payer: Medicaid Other | Admitting: Obstetrics and Gynecology

## 2012-07-29 ENCOUNTER — Ambulatory Visit (INDEPENDENT_AMBULATORY_CARE_PROVIDER_SITE_OTHER): Payer: Medicaid Other

## 2012-07-29 DIAGNOSIS — IMO0001 Reserved for inherently not codable concepts without codable children: Secondary | ICD-10-CM

## 2012-07-29 NOTE — Progress Notes (Signed)
[redacted]w[redacted]d Office Visit on 07/16/2012  Component Date Value Range Status  . WBC 07/16/2012 10.1  4.0 - 10.5 K/uL Final  . RBC 07/16/2012 4.29  3.87 - 5.11 MIL/uL Final  . Hemoglobin 07/16/2012 12.3  12.0 - 15.0 g/dL Final  . HCT 16/06/9603 37.0  36.0 - 46.0 % Final  . MCV 07/16/2012 86.2  78.0 - 100.0 fL Final  . MCH 07/16/2012 28.7  26.0 - 34.0 pg Final  . MCHC 07/16/2012 33.2  30.0 - 36.0 g/dL Final  . RDW 54/05/8118 13.3  11.5 - 15.5 % Final  . Platelets 07/16/2012 298  150 - 400 K/uL Final  . RPR 07/16/2012 NON REAC  NON REAC Final  . Glucose, 1 Hour GTT 07/16/2012 107  70 - 140 mg/dL Final  no complaints BPP 8/8 and tech reports dec FHR to 110s then back up to 130s so will do NST as well FKCs and labor precautions U/S today - 5lbs 1oz 60%, AFI 17.9, vtx, ant placenta NST reactive RTO 2wks

## 2012-07-31 LAB — US OB FOLLOW UP

## 2012-08-12 ENCOUNTER — Encounter: Payer: Medicaid Other | Admitting: Obstetrics and Gynecology

## 2012-08-13 ENCOUNTER — Ambulatory Visit (INDEPENDENT_AMBULATORY_CARE_PROVIDER_SITE_OTHER): Payer: Medicaid Other | Admitting: Obstetrics and Gynecology

## 2012-08-13 ENCOUNTER — Encounter: Payer: Self-pay | Admitting: Obstetrics and Gynecology

## 2012-08-13 VITALS — BP 110/62 | Wt 242.0 lb

## 2012-08-13 DIAGNOSIS — Z331 Pregnant state, incidental: Secondary | ICD-10-CM

## 2012-08-13 NOTE — Patient Instructions (Signed)
Fetal Movement Counts Patient Name: __________________________________________________ Patient Due Date: ____________________ Kick counts is highly recommended in high risk pregnancies, but it is a good idea for every pregnant woman to do. Start counting fetal movements at 28 weeks of the pregnancy. Fetal movements increase after eating a full meal or eating or drinking something sweet (the blood sugar is higher). It is also important to drink plenty of fluids (well hydrated) before doing the count. Lie on your left side because it helps with the circulation or you can sit in a comfortable chair with your arms over your belly (abdomen) with no distractions around you. DOING THE COUNT  Try to do the count the same time of day each time you do it.  Mark the day and time, then see how long it takes for you to feel 10 movements (kicks, flutters, swishes, rolls). You should have at least 10 movements within 2 hours. You will most likely feel 10 movements in much less than 2 hours. If you do not, wait an hour and count again. After a couple of days you will see a pattern.  What you are looking for is a change in the pattern or not enough counts in 2 hours. Is it taking longer in time to reach 10 movements? SEEK MEDICAL CARE IF:  You feel less than 10 counts in 2 hours. Tried twice.  No movement in one hour.  The pattern is changing or taking longer each day to reach 10 counts in 2 hours.  You feel the baby is not moving as it usually does. Date: ____________ Movements: ____________ Start time: ____________ Finish time: ____________  Date: ____________ Movements: ____________ Start time: ____________ Finish time: ____________ Date: ____________ Movements: ____________ Start time: ____________ Finish time: ____________ Date: ____________ Movements: ____________ Start time: ____________ Finish time: ____________ Date: ____________ Movements: ____________ Start time: ____________ Finish time:  ____________ Date: ____________ Movements: ____________ Start time: ____________ Finish time: ____________ Date: ____________ Movements: ____________ Start time: ____________ Finish time: ____________ Date: ____________ Movements: ____________ Start time: ____________ Finish time: ____________  Date: ____________ Movements: ____________ Start time: ____________ Finish time: ____________ Date: ____________ Movements: ____________ Start time: ____________ Finish time: ____________ Date: ____________ Movements: ____________ Start time: ____________ Finish time: ____________ Date: ____________ Movements: ____________ Start time: ____________ Finish time: ____________ Date: ____________ Movements: ____________ Start time: ____________ Finish time: ____________ Date: ____________ Movements: ____________ Start time: ____________ Finish time: ____________ Date: ____________ Movements: ____________ Start time: ____________ Finish time: ____________  Date: ____________ Movements: ____________ Start time: ____________ Finish time: ____________ Date: ____________ Movements: ____________ Start time: ____________ Finish time: ____________ Date: ____________ Movements: ____________ Start time: ____________ Finish time: ____________ Date: ____________ Movements: ____________ Start time: ____________ Finish time: ____________ Date: ____________ Movements: ____________ Start time: ____________ Finish time: ____________ Date: ____________ Movements: ____________ Start time: ____________ Finish time: ____________ Date: ____________ Movements: ____________ Start time: ____________ Finish time: ____________  Date: ____________ Movements: ____________ Start time: ____________ Finish time: ____________ Date: ____________ Movements: ____________ Start time: ____________ Finish time: ____________ Date: ____________ Movements: ____________ Start time: ____________ Finish time: ____________ Date: ____________ Movements:  ____________ Start time: ____________ Finish time: ____________ Date: ____________ Movements: ____________ Start time: ____________ Finish time: ____________ Date: ____________ Movements: ____________ Start time: ____________ Finish time: ____________ Date: ____________ Movements: ____________ Start time: ____________ Finish time: ____________  Date: ____________ Movements: ____________ Start time: ____________ Finish time: ____________ Date: ____________ Movements: ____________ Start time: ____________ Finish time: ____________ Date: ____________ Movements: ____________ Start time: ____________ Finish time: ____________ Date: ____________ Movements:   ____________ Start time: ____________ Finish time: ____________ Date: ____________ Movements: ____________ Start time: ____________ Finish time: ____________ Date: ____________ Movements: ____________ Start time: ____________ Finish time: ____________ Date: ____________ Movements: ____________ Start time: ____________ Finish time: ____________  Date: ____________ Movements: ____________ Start time: ____________ Finish time: ____________ Date: ____________ Movements: ____________ Start time: ____________ Finish time: ____________ Date: ____________ Movements: ____________ Start time: ____________ Finish time: ____________ Date: ____________ Movements: ____________ Start time: ____________ Finish time: ____________ Date: ____________ Movements: ____________ Start time: ____________ Finish time: ____________ Date: ____________ Movements: ____________ Start time: ____________ Finish time: ____________ Date: ____________ Movements: ____________ Start time: ____________ Finish time: ____________  Date: ____________ Movements: ____________ Start time: ____________ Finish time: ____________ Date: ____________ Movements: ____________ Start time: ____________ Finish time: ____________ Date: ____________ Movements: ____________ Start time: ____________ Finish  time: ____________ Date: ____________ Movements: ____________ Start time: ____________ Finish time: ____________ Date: ____________ Movements: ____________ Start time: ____________ Finish time: ____________ Date: ____________ Movements: ____________ Start time: ____________ Finish time: ____________ Date: ____________ Movements: ____________ Start time: ____________ Finish time: ____________  Date: ____________ Movements: ____________ Start time: ____________ Finish time: ____________ Date: ____________ Movements: ____________ Start time: ____________ Finish time: ____________ Date: ____________ Movements: ____________ Start time: ____________ Finish time: ____________ Date: ____________ Movements: ____________ Start time: ____________ Finish time: ____________ Date: ____________ Movements: ____________ Start time: ____________ Finish time: ____________ Date: ____________ Movements: ____________ Start time: ____________ Finish time: ____________ Document Released: 10/10/2006 Document Revised: 12/03/2011 Document Reviewed: 04/12/2009 [employer] Patient Information 2013 [employer].  

## 2012-08-13 NOTE — Progress Notes (Signed)
Pt c/o varicose veins. Pt states baby has been having hiccups often, concerned about umbilical cord.

## 2012-08-13 NOTE — Progress Notes (Signed)
A/P GBS done today Fetal kick counts reviewed Labor reviewed with pt All patients  questions answered Pt reassured abt hiccups growth US@NV 

## 2012-08-15 LAB — STREP B DNA PROBE: GBSP: NEGATIVE

## 2012-08-25 ENCOUNTER — Ambulatory Visit (INDEPENDENT_AMBULATORY_CARE_PROVIDER_SITE_OTHER): Payer: Medicaid Other | Admitting: Obstetrics and Gynecology

## 2012-08-25 ENCOUNTER — Encounter: Payer: Self-pay | Admitting: Obstetrics and Gynecology

## 2012-08-25 ENCOUNTER — Ambulatory Visit (INDEPENDENT_AMBULATORY_CARE_PROVIDER_SITE_OTHER): Payer: Medicaid Other

## 2012-08-25 VITALS — BP 112/58 | Wt 243.0 lb

## 2012-08-25 DIAGNOSIS — Z23 Encounter for immunization: Secondary | ICD-10-CM

## 2012-08-25 DIAGNOSIS — Z331 Pregnant state, incidental: Secondary | ICD-10-CM

## 2012-08-25 NOTE — Patient Instructions (Signed)
Fetal Movement Counts Patient Name: __________________________________________________ Patient Due Date: ____________________ Kick counts is highly recommended in high risk pregnancies, but it is a good idea for every pregnant woman to do. Start counting fetal movements at 28 weeks of the pregnancy. Fetal movements increase after eating a full meal or eating or drinking something sweet (the blood sugar is higher). It is also important to drink plenty of fluids (well hydrated) before doing the count. Lie on your left side because it helps with the circulation or you can sit in a comfortable chair with your arms over your belly (abdomen) with no distractions around you. DOING THE COUNT  Try to do the count the same time of day each time you do it.  Mark the day and time, then see how long it takes for you to feel 10 movements (kicks, flutters, swishes, rolls). You should have at least 10 movements within 2 hours. You will most likely feel 10 movements in much less than 2 hours. If you do not, wait an hour and count again. After a couple of days you will see a pattern.  What you are looking for is a change in the pattern or not enough counts in 2 hours. Is it taking longer in time to reach 10 movements? SEEK MEDICAL CARE IF:  You feel less than 10 counts in 2 hours. Tried twice.  No movement in one hour.  The pattern is changing or taking longer each day to reach 10 counts in 2 hours.  You feel the baby is not moving as it usually does. Date: ____________ Movements: ____________ Start time: ____________ Finish time: ____________  Date: ____________ Movements: ____________ Start time: ____________ Finish time: ____________ Date: ____________ Movements: ____________ Start time: ____________ Finish time: ____________ Date: ____________ Movements: ____________ Start time: ____________ Finish time: ____________ Date: ____________ Movements: ____________ Start time: ____________ Finish time:  ____________ Date: ____________ Movements: ____________ Start time: ____________ Finish time: ____________ Date: ____________ Movements: ____________ Start time: ____________ Finish time: ____________ Date: ____________ Movements: ____________ Start time: ____________ Finish time: ____________  Date: ____________ Movements: ____________ Start time: ____________ Finish time: ____________ Date: ____________ Movements: ____________ Start time: ____________ Finish time: ____________ Date: ____________ Movements: ____________ Start time: ____________ Finish time: ____________ Date: ____________ Movements: ____________ Start time: ____________ Finish time: ____________ Date: ____________ Movements: ____________ Start time: ____________ Finish time: ____________ Date: ____________ Movements: ____________ Start time: ____________ Finish time: ____________ Date: ____________ Movements: ____________ Start time: ____________ Finish time: ____________  Date: ____________ Movements: ____________ Start time: ____________ Finish time: ____________ Date: ____________ Movements: ____________ Start time: ____________ Finish time: ____________ Date: ____________ Movements: ____________ Start time: ____________ Finish time: ____________ Date: ____________ Movements: ____________ Start time: ____________ Finish time: ____________ Date: ____________ Movements: ____________ Start time: ____________ Finish time: ____________ Date: ____________ Movements: ____________ Start time: ____________ Finish time: ____________ Date: ____________ Movements: ____________ Start time: ____________ Finish time: ____________  Date: ____________ Movements: ____________ Start time: ____________ Finish time: ____________ Date: ____________ Movements: ____________ Start time: ____________ Finish time: ____________ Date: ____________ Movements: ____________ Start time: ____________ Finish time: ____________ Date: ____________ Movements:  ____________ Start time: ____________ Finish time: ____________ Date: ____________ Movements: ____________ Start time: ____________ Finish time: ____________ Date: ____________ Movements: ____________ Start time: ____________ Finish time: ____________ Date: ____________ Movements: ____________ Start time: ____________ Finish time: ____________  Date: ____________ Movements: ____________ Start time: ____________ Finish time: ____________ Date: ____________ Movements: ____________ Start time: ____________ Finish time: ____________ Date: ____________ Movements: ____________ Start time: ____________ Finish time: ____________ Date: ____________ Movements:   ____________ Start time: ____________ Finish time: ____________ Date: ____________ Movements: ____________ Start time: ____________ Finish time: ____________ Date: ____________ Movements: ____________ Start time: ____________ Finish time: ____________ Date: ____________ Movements: ____________ Start time: ____________ Finish time: ____________  Date: ____________ Movements: ____________ Start time: ____________ Finish time: ____________ Date: ____________ Movements: ____________ Start time: ____________ Finish time: ____________ Date: ____________ Movements: ____________ Start time: ____________ Finish time: ____________ Date: ____________ Movements: ____________ Start time: ____________ Finish time: ____________ Date: ____________ Movements: ____________ Start time: ____________ Finish time: ____________ Date: ____________ Movements: ____________ Start time: ____________ Finish time: ____________ Date: ____________ Movements: ____________ Start time: ____________ Finish time: ____________  Date: ____________ Movements: ____________ Start time: ____________ Finish time: ____________ Date: ____________ Movements: ____________ Start time: ____________ Finish time: ____________ Date: ____________ Movements: ____________ Start time: ____________ Finish  time: ____________ Date: ____________ Movements: ____________ Start time: ____________ Finish time: ____________ Date: ____________ Movements: ____________ Start time: ____________ Finish time: ____________ Date: ____________ Movements: ____________ Start time: ____________ Finish time: ____________ Date: ____________ Movements: ____________ Start time: ____________ Finish time: ____________  Date: ____________ Movements: ____________ Start time: ____________ Finish time: ____________ Date: ____________ Movements: ____________ Start time: ____________ Finish time: ____________ Date: ____________ Movements: ____________ Start time: ____________ Finish time: ____________ Date: ____________ Movements: ____________ Start time: ____________ Finish time: ____________ Date: ____________ Movements: ____________ Start time: ____________ Finish time: ____________ Date: ____________ Movements: ____________ Start time: ____________ Finish time: ____________ Document Released: 10/10/2006 Document Revised: 12/03/2011 Document Reviewed: 04/12/2009 [employer] Patient Information 2013 [employer].  

## 2012-08-25 NOTE — Progress Notes (Signed)
[redacted]w[redacted]d [place] EFW 7-7 76% AFI 13.1 Anterior placenta GBS neg FKC reviewed

## 2012-08-29 LAB — US OB FOLLOW UP

## 2012-09-04 ENCOUNTER — Ambulatory Visit (INDEPENDENT_AMBULATORY_CARE_PROVIDER_SITE_OTHER): Payer: Medicaid Other | Admitting: Obstetrics and Gynecology

## 2012-09-04 ENCOUNTER — Encounter: Payer: Self-pay | Admitting: Obstetrics and Gynecology

## 2012-09-04 VITALS — BP 100/62 | Wt 240.0 lb

## 2012-09-04 DIAGNOSIS — O368190 Decreased fetal movements, unspecified trimester, not applicable or unspecified: Secondary | ICD-10-CM

## 2012-09-04 DIAGNOSIS — Z331 Pregnant state, incidental: Secondary | ICD-10-CM

## 2012-09-04 NOTE — Progress Notes (Signed)
[redacted]w[redacted]d Decreased fetal movement: NST reactive

## 2012-09-04 NOTE — Progress Notes (Signed)
[redacted]w[redacted]d Pt request cx check today. Pt states baby isn't moving as much.

## 2012-09-10 ENCOUNTER — Ambulatory Visit (INDEPENDENT_AMBULATORY_CARE_PROVIDER_SITE_OTHER): Payer: Medicaid Other | Admitting: Obstetrics and Gynecology

## 2012-09-10 ENCOUNTER — Encounter: Payer: Self-pay | Admitting: Obstetrics and Gynecology

## 2012-09-10 VITALS — BP 114/62 | Wt 243.0 lb

## 2012-09-10 DIAGNOSIS — Z331 Pregnant state, incidental: Secondary | ICD-10-CM

## 2012-09-10 MED ORDER — PANTOPRAZOLE SODIUM 40 MG PO TBEC: 40.0000 mg | DELAYED_RELEASE_TABLET | Freq: Every day | ORAL | Status: DC

## 2012-09-10 NOTE — Progress Notes (Signed)
[redacted]w[redacted]d Pt has a lot of nausea Pt would like cervix checked

## 2012-09-10 NOTE — Progress Notes (Signed)
[redacted]w[redacted]d GFM Heartburn discussed. Protonix sent to pharmacy

## 2012-09-15 ENCOUNTER — Ambulatory Visit (INDEPENDENT_AMBULATORY_CARE_PROVIDER_SITE_OTHER): Payer: Medicaid Other | Admitting: Obstetrics and Gynecology

## 2012-09-15 ENCOUNTER — Telehealth: Payer: Self-pay | Admitting: Obstetrics and Gynecology

## 2012-09-15 ENCOUNTER — Encounter: Payer: Medicaid Other | Admitting: Obstetrics and Gynecology

## 2012-09-15 ENCOUNTER — Encounter: Payer: Self-pay | Admitting: Obstetrics and Gynecology

## 2012-09-15 VITALS — BP 104/64 | Wt 238.0 lb

## 2012-09-15 DIAGNOSIS — Z331 Pregnant state, incidental: Secondary | ICD-10-CM

## 2012-09-15 NOTE — Progress Notes (Signed)
[redacted]w[redacted]d No headaches, blurred vision, or right upper quadrant tenderness. Abdomen nontender.  Reflexes normal. Repeat urine analysis:negative protein. Preeclampsia discussed. Will schedule induction for 41 weeks.

## 2012-09-15 NOTE — Progress Notes (Signed)
[redacted]w[redacted]d Pt would like cervix checked

## 2012-09-15 NOTE — Telephone Encounter (Signed)
Induction scheduled for 09/21/12 @ 7:30am with ND/VL. -Adrianne Pridgen

## 2012-09-17 ENCOUNTER — Encounter (HOSPITAL_COMMUNITY): Payer: Self-pay | Admitting: *Deleted

## 2012-09-17 ENCOUNTER — Inpatient Hospital Stay (HOSPITAL_COMMUNITY)
Admission: AD | Admit: 2012-09-17 | Discharge: 2012-09-17 | Disposition: A | Discharge status: Home / Self Care | Payer: Medicaid Other | Source: Ambulatory Visit | Attending: Obstetrics and Gynecology | Admitting: Obstetrics and Gynecology

## 2012-09-17 DIAGNOSIS — O2300 Infections of kidney in pregnancy, unspecified trimester: Secondary | ICD-10-CM

## 2012-09-17 DIAGNOSIS — O479 False labor, unspecified: Secondary | ICD-10-CM

## 2012-09-17 DIAGNOSIS — O368190 Decreased fetal movements, unspecified trimester, not applicable or unspecified: Secondary | ICD-10-CM | POA: Insufficient documentation

## 2012-09-17 DIAGNOSIS — N949 Unspecified condition associated with female genital organs and menstrual cycle: Secondary | ICD-10-CM | POA: Insufficient documentation

## 2012-09-17 DIAGNOSIS — O2630 Retained intrauterine contraceptive device in pregnancy, unspecified trimester: Secondary | ICD-10-CM | POA: Diagnosis not present

## 2012-09-17 DIAGNOSIS — N200 Calculus of kidney: Secondary | ICD-10-CM

## 2012-09-17 DIAGNOSIS — O352XX0 Maternal care for (suspected) hereditary disease in fetus, not applicable or unspecified: Secondary | ICD-10-CM | POA: Diagnosis not present

## 2012-09-17 DIAGNOSIS — O99891 Other specified diseases and conditions complicating pregnancy: Secondary | ICD-10-CM | POA: Insufficient documentation

## 2012-09-17 NOTE — MAU Provider Note (Signed)
History   26 yo G4P3003 at 40 3/7 weeks presented unannounced c/o pelvic pressure, ? Decreased FM, ? Leaking.  Reports +FM, but "just not as much" last 24 hours.  Has sporadic contractions, but differentiates pelvic pressure from UCs--reports pressure "feels like something is falling out".  Has felt more wet, but no active gushes of fluid.  Scheduled for induction 09/21/12 for postdates.  Patient Active Problem List  Diagnosis  . UTI (urinary tract infection) during pregnancy  . Abnormal umbilical cord  . Desires tubal ligation after delivery  . 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 Hx Pyelonephritis in previous pregnancy  . Pregnancy with IUD (intrauterine device)--removed 02/2012  . Hx Kidney stone  Hypocoiled cord noted on US--has been followed with growth [place] q 4 weeks, all WNL. Last [place] at 37 weeks, with EFW 7+7, normal fluid.    Chief Complaint  Patient presents with  . Labor Eval  . Rupture of Membranes     OB History    Grav Para Term Preterm Abortions TAB SAB Ect Mult Living   4 3 3  0 0 0 0 0 0 3      Past Medical History  Diagnosis Date  . H/O bladder infections   . Abnormal Pap smear 2009    COLPO; ;LAST PAP 2009  . Varicose veins 2008  . Anemia     WITH PREGNANCY 2004  . Infection 02/2009    UTI  . Infection     YEAST X 1  . Infection 2009    PYELO WITH PREG  . Chronic kidney disease 2010    KIDNEY STONE  . Yeast infection   . H/O varicella   . Kidney infection   . 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 No past surgeries     Family History  Problem Relation Age of Onset  . Anesthesia problems Neg Hx   . Other Mother     VARICOSE VEINS  . Diabetes Father   . Hypertension Father   . Other Son     ARTHROGRYPOSIS  . Vision loss Son     DUANE'S SYNDROME  . Cancer Maternal Grandfather     LUNG  . Diabetes  Paternal Grandmother   . Cancer Paternal Grandfather     LUNG    History  Substance Use Topics  . Smoking status: Former Smoker    Quit date: 09/24/2005  . Smokeless tobacco: Never Used  . Alcohol Use: No    Allergies: No Known Allergies  Prescriptions prior to admission  Medication Sig Dispense Refill  . pantoprazole (PROTONIX) 40 MG tablet Take 1 tablet (40 mg total) by mouth daily.  30 tablet  1  . Prenatal Vit-Fe Fumarate-FA (MULTIVITAMIN-PRENATAL) 27-0.8 MG TABS Take 1 tablet by mouth daily. OTC      . cephALEXin (KEFLEX) 250 MG capsule Take 250 mg by mouth 4 (four) times daily.      . Famotidine (PEPCID PO) Take by mouth.      . promethazine (PHENERGAN) 25 MG tablet Take 1 tablet (25 mg total) by mouth every 6 (six) hours as needed for nausea.  30 tablet  0     Physical Exam   Blood pressure 121/74, pulse 106, temperature 97.6 F (36.4 C), temperature source Oral, resp. rate 20, height 5\' 4"  (1.626 m), weight 245 lb (111.131 kg),  SpO2 100.00%.  Chest clear Heart RRR without murmur Abd gravid, NT Pelvic--cervix 2-3 cm, 50%, vtx, -2, posterior, no leaking of fluid noted. Ext WNL  FHR reactive, no decels Irregular, mild contractions.  ED Course  IUP at 40 3/7 weeks ? Leaking Reactive NST  Plan: Amnisure--if negative, plan d/c home. Keep scheduled induction date or return prn.   Nigel Bridgeman CNM, [place] 09/17/2012 1:37 AM   Addendum: Amnisure negative. Home with labor and FKC instructions. F/U prn or as scheduled 09/21/12  Nigel Bridgeman, CNM, [place] 09/17/12 1:55a

## 2012-09-17 NOTE — MAU Note (Signed)
Pt presents with complaint of pressure x 3 hours that is worsening, ? Leaking fluid since 2300. Also reports decreased fetal movement

## 2012-09-18 ENCOUNTER — Telehealth (HOSPITAL_COMMUNITY): Payer: Self-pay | Admitting: *Deleted

## 2012-09-18 ENCOUNTER — Encounter (HOSPITAL_COMMUNITY): Payer: Self-pay | Admitting: *Deleted

## 2012-09-18 NOTE — Telephone Encounter (Signed)
Preadmission screen  

## 2012-09-20 NOTE — H&P (Signed)
Madison Mays is a 26 y.o. female, G4P3003 at 80 weeks, presenting for induction due to postdates and favorable cervix.  Reports +FM, irregular contractions.  Denies leaking or bleeding.  Reports onset of URI sx earlier this week, with productive cough and congestion.  Denies fever, sore throat, or flu-like sx.  Patient Active Problem List  Diagnosis  . UTI (urinary tract infection) during pregnancy  . Abnormal umbilical cord  . Desires tubal ligation after delivery  . 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 Hx Pyelonephritis in previous pregnancy  . Pregnancy with IUD (intrauterine device)--removed 02/2012  . Hx Kidney stone    History of present pregnancy: Patient entered care at 12 1/7 weeks.   EDC of 09/14/12 was established by LMP and was in agreement with [place] at 12 weeks.   Anatomy scan:  21 3/7 weeks, with normal anatomy, hypocoiled cord, and an anterior placenta.   Additional [place] evaluations:   28 3/7 weeks--EFW 3+2, normal cervical length, AFI 75%ile 33 2/7 weeks--5+1, 60%ile, AFI 17.9, vtx, normal cervix 37 1/7 weeks--7+7, 76%ile, AFI 13.1, vtx Significant prenatal events:  Evaluation in MAU 12/25 for ? Leaking--amnisure negative, cervix 2-3 cm.   Desires BTL--consent signed 07/29/12.   OB History    Grav Para Term Preterm Abortions TAB SAB Ect Mult Living   4 3 3  0 0 0 0 0 0 3    2004--SVB at 41 weeks, 72 hour labor, femal, 6+7, in [place], no pain medication 2008--SVB at 41 weeks, 8 hour labor, 8 lbs, female, epidural, in [place] 2009--SVB at 40 weeks, 8 hour labor, 6 lbs, female, epidural, at [facility]  Past Medical History  Diagnosis Date  . H/O bladder infections   . Abnormal Pap smear 2009    COLPO; ;LAST PAP 2009  . Varicose veins 2008  . Anemia     WITH PREGNANCY 2004  . Infection 02/2009    UTI  . Infection     YEAST X 1  . Infection 2009    PYELO WITH PREG  . Chronic kidney disease 2010    KIDNEY STONE  . Yeast  infection   . H/O varicella   . Kidney infection   . 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 No past surgeries    Family History: family history includes Cancer in her maternal grandfather and paternal grandfather; Diabetes in her father and paternal grandmother; Hypertension in her father; Other in her mother and son; and Vision loss in her son.  There is no history of Anesthesia problems.  Social History:  reports that she quit smoking about 6 years ago. She has never used smokeless tobacco. She reports that she does not drink alcohol or use illicit drugs. FOB, Erasmo Score, is involved and supportive.  Patient is a SAHM.   Prenatal Transfer Tool  Maternal Diabetes: No Genetic Screening: Normal Maternal Ultrasounds/Referrals: Abnormal:  Findings:   Other:Hypocoiled cord--followed with growth [place] during pregnancy Fetal Ultrasounds or other Referrals:  None Maternal Substance Abuse:  No Significant Maternal Medications:  None Significant Maternal Lab Results: Lab values include: Group B Strep negative    ROS:  Occasional contractions, +FM  No Known Allergies     Height 5\' 4"  (1.626 m), weight 245 lb (111.131 kg).  Chest clear, but has bronchial cough Heart RRR without murmur Abd gravid, NT, FH 40 cm  Pelvic: 3 cm, 50%, vtx, -2, IBOW Ext: WNL  FHR: Category 1 UCs:  Irregular, mild.  Prenatal labs: ABO, Rh: A/POS/-- (08/06 0944) Antibody: NEG (08/06 0944) Rubella:   Immune RPR: NON REAC (10/23 1103)  HBsAg: NEGATIVE (08/06 0944)  HIV: NON REACTIVE (08/06 0944)  GBS: NEGATIVE (11/20 1200) Sickle cell/Hgb electrophoresis:  NA Pap:  WNL 04/2012 GC:  Negative at NOB Chlamydia:  Negative at NOB Genetic screenings:  Normal Quad screen Glucola:  WNL x 2 during pregnancy Other:  Hgb 13.1 at NOB, 12.3 at 28 weeks.       Assessment/Plan: IUP at 41 weeks Favorable  cervix Desires BTL--consent signed 07/29/12 GBS negative URI--not c/w flu  Plan: Admit to Birthing Suite per consult with Dr. Normand Sloop Routine [facility] orders Plan pitocin induction per low dose protocol. Pain med/epidural prn. Plan BTL pp--maintain epidural.  R&B reviewed, including bleeding, infection, damage to other organs, and failure of method.  Patient seems to understand these risks and still wishes to proceed with pp BTL. Rx Tussionex and Zpak AROM as labor advances.  Shontay Wallner, VICKICNM, [place] 09/21/2012, 7:52 AM

## 2012-09-21 ENCOUNTER — Encounter (HOSPITAL_COMMUNITY): Payer: Self-pay

## 2012-09-21 ENCOUNTER — Encounter (HOSPITAL_COMMUNITY): Payer: Self-pay | Admitting: Anesthesiology

## 2012-09-21 ENCOUNTER — Inpatient Hospital Stay (HOSPITAL_COMMUNITY): Payer: Medicaid Other | Admitting: Anesthesiology

## 2012-09-21 ENCOUNTER — Inpatient Hospital Stay (HOSPITAL_COMMUNITY)
Admission: RE | Admit: 2012-09-21 | Discharge: 2012-09-23 | DRG: 767 | Disposition: A | Discharge status: Home / Self Care | Payer: Medicaid Other | Source: Ambulatory Visit | Attending: Obstetrics and Gynecology | Admitting: Obstetrics and Gynecology

## 2012-09-21 VITALS — BP 108/74 | HR 91 | Temp 98.7°F | Resp 18 | Ht 64.0 in | Wt 245.0 lb

## 2012-09-21 DIAGNOSIS — Z302 Encounter for sterilization: Secondary | ICD-10-CM

## 2012-09-21 DIAGNOSIS — O99892 Other specified diseases and conditions complicating childbirth: Secondary | ICD-10-CM | POA: Diagnosis present

## 2012-09-21 DIAGNOSIS — O480 Post-term pregnancy: Principal | ICD-10-CM | POA: Diagnosis present

## 2012-09-21 DIAGNOSIS — O2300 Infections of kidney in pregnancy, unspecified trimester: Secondary | ICD-10-CM

## 2012-09-21 DIAGNOSIS — O2630 Retained intrauterine contraceptive device in pregnancy, unspecified trimester: Secondary | ICD-10-CM

## 2012-09-21 DIAGNOSIS — O352XX0 Maternal care for (suspected) hereditary disease in fetus, not applicable or unspecified: Secondary | ICD-10-CM

## 2012-09-21 DIAGNOSIS — J069 Acute upper respiratory infection, unspecified: Secondary | ICD-10-CM | POA: Diagnosis present

## 2012-09-21 DIAGNOSIS — N200 Calculus of kidney: Secondary | ICD-10-CM

## 2012-09-21 LAB — CBC
Platelets: 297 10*3/uL (ref 150–400)
RBC: 4.48 MIL/uL (ref 3.87–5.11)
RDW: 13.2 % (ref 11.5–15.5)
WBC: 10.7 10*3/uL — ABNORMAL HIGH (ref 4.0–10.5)

## 2012-09-21 LAB — TYPE AND SCREEN
ABO/RH(D): A POS
Antibody Screen: NEGATIVE

## 2012-09-21 MED ORDER — AZITHROMYCIN 500 MG PO TABS
500.0000 mg | ORAL_TABLET | Freq: Every day | ORAL | Status: DC
  Administered 2012-09-21 – 2012-09-23 (×3): 500 mg via ORAL
  Filled 2012-09-21 (×4): qty 1

## 2012-09-21 MED ORDER — LACTATED RINGERS IV SOLN
500.0000 mL | Freq: Once | INTRAVENOUS | Status: AC
  Administered 2012-09-21: 500 mL via INTRAVENOUS

## 2012-09-21 MED ORDER — IBUPROFEN 600 MG PO TABS
600.0000 mg | ORAL_TABLET | Freq: Four times a day (QID) | ORAL | Status: DC
  Administered 2012-09-21 – 2012-09-23 (×5): 600 mg via ORAL
  Filled 2012-09-21 (×6): qty 1

## 2012-09-21 MED ORDER — CITRIC ACID-SODIUM CITRATE 334-500 MG/5ML PO SOLN: 30.0000 mL | ORAL | Status: DC | PRN

## 2012-09-21 MED ORDER — FENTANYL 2.5 MCG/ML BUPIVACAINE 1/10 % EPIDURAL INFUSION (WH - ANES)
INTRAMUSCULAR | Status: DC | PRN
  Administered 2012-09-21: 14 mL/h via EPIDURAL

## 2012-09-21 MED ORDER — DIBUCAINE 1 % RE OINT: 1.0000 "application " | TOPICAL_OINTMENT | RECTAL | Status: DC | PRN

## 2012-09-21 MED ORDER — DIPHENHYDRAMINE HCL 25 MG PO CAPS: 25.0000 mg | ORAL_CAPSULE | Freq: Four times a day (QID) | ORAL | Status: DC | PRN

## 2012-09-21 MED ORDER — EPHEDRINE 5 MG/ML INJ: 10.0000 mg | INTRAVENOUS | Status: DC | PRN

## 2012-09-21 MED ORDER — IBUPROFEN 600 MG PO TABS: 600.0000 mg | ORAL_TABLET | Freq: Four times a day (QID) | ORAL | Status: DC | PRN

## 2012-09-21 MED ORDER — METOCLOPRAMIDE HCL 10 MG PO TABS
10.0000 mg | ORAL_TABLET | Freq: Once | ORAL | Status: AC
  Administered 2012-09-22: 10 mg via ORAL
  Filled 2012-09-21: qty 1

## 2012-09-21 MED ORDER — BENZOCAINE-MENTHOL 20-0.5 % EX AERO: 1.0000 "application " | INHALATION_SPRAY | CUTANEOUS | Status: DC | PRN

## 2012-09-21 MED ORDER — LIDOCAINE HCL (PF) 1 % IJ SOLN
30.0000 mL | INTRAMUSCULAR | Status: DC | PRN
  Filled 2012-09-21: qty 30

## 2012-09-21 MED ORDER — OXYCODONE-ACETAMINOPHEN 5-325 MG PO TABS
1.0000 | ORAL_TABLET | ORAL | Status: DC | PRN
  Administered 2012-09-21 – 2012-09-22 (×2): 1 via ORAL
  Administered 2012-09-23 (×2): 2 via ORAL
  Filled 2012-09-21: qty 2
  Filled 2012-09-21: qty 1
  Filled 2012-09-21 (×2): qty 2

## 2012-09-21 MED ORDER — OXYTOCIN 40 UNITS IN LACTATED RINGERS INFUSION - SIMPLE MED
62.5000 mL/h | INTRAVENOUS | Status: DC
  Administered 2012-09-21: 62.5 mL/h via INTRAVENOUS
  Filled 2012-09-21: qty 1000

## 2012-09-21 MED ORDER — AZITHROMYCIN 250 MG PO TABS
500.0000 mg | ORAL_TABLET | Freq: Every day | ORAL | Status: AC
  Administered 2012-09-21: 500 mg via ORAL
  Filled 2012-09-21: qty 1

## 2012-09-21 MED ORDER — WITCH HAZEL-GLYCERIN EX PADS: 1.0000 "application " | MEDICATED_PAD | CUTANEOUS | Status: DC | PRN

## 2012-09-21 MED ORDER — OXYCODONE-ACETAMINOPHEN 5-325 MG PO TABS: 1.0000 | ORAL_TABLET | ORAL | Status: DC | PRN

## 2012-09-21 MED ORDER — FENTANYL 2.5 MCG/ML BUPIVACAINE 1/10 % EPIDURAL INFUSION (WH - ANES)
14.0000 mL/h | INTRAMUSCULAR | Status: DC
  Filled 2012-09-21: qty 125

## 2012-09-21 MED ORDER — ONDANSETRON HCL 4 MG/2ML IJ SOLN: 4.0000 mg | Freq: Four times a day (QID) | INTRAMUSCULAR | Status: DC | PRN

## 2012-09-21 MED ORDER — ACETAMINOPHEN 325 MG PO TABS: 650.0000 mg | ORAL_TABLET | ORAL | Status: DC | PRN

## 2012-09-21 MED ORDER — LANOLIN HYDROUS EX OINT: TOPICAL_OINTMENT | CUTANEOUS | Status: DC | PRN

## 2012-09-21 MED ORDER — LACTATED RINGERS IV SOLN
INTRAVENOUS | Status: DC
  Administered 2012-09-21 (×2): via INTRAVENOUS

## 2012-09-21 MED ORDER — FAMOTIDINE 20 MG PO TABS
40.0000 mg | ORAL_TABLET | Freq: Once | ORAL | Status: AC
  Administered 2012-09-22: 40 mg via ORAL
  Filled 2012-09-21: qty 1

## 2012-09-21 MED ORDER — HYDROCOD POLST-CHLORPHEN POLST 10-8 MG/5ML PO LQCR
5.0000 mL | Freq: Four times a day (QID) | ORAL | Status: DC | PRN
  Administered 2012-09-21 – 2012-09-23 (×4): 5 mL via ORAL
  Filled 2012-09-21 (×4): qty 5

## 2012-09-21 MED ORDER — LACTATED RINGERS IV SOLN: 500.0000 mL | INTRAVENOUS | Status: DC | PRN

## 2012-09-21 MED ORDER — TETANUS-DIPHTH-ACELL PERTUSSIS 5-2.5-18.5 LF-MCG/0.5 IM SUSP: 0.5000 mL | Freq: Once | INTRAMUSCULAR | Status: DC

## 2012-09-21 MED ORDER — AZITHROMYCIN 250 MG PO TABS
250.0000 mg | ORAL_TABLET | Freq: Every day | ORAL | Status: DC
  Filled 2012-09-21: qty 1

## 2012-09-21 MED ORDER — EPHEDRINE 5 MG/ML INJ
10.0000 mg | INTRAVENOUS | Status: DC | PRN
  Filled 2012-09-21: qty 4

## 2012-09-21 MED ORDER — SIMETHICONE 80 MG PO CHEW: 80.0000 mg | CHEWABLE_TABLET | ORAL | Status: DC | PRN

## 2012-09-21 MED ORDER — SENNOSIDES-DOCUSATE SODIUM 8.6-50 MG PO TABS
2.0000 | ORAL_TABLET | Freq: Every day | ORAL | Status: DC
  Administered 2012-09-21 – 2012-09-22 (×2): 2 via ORAL

## 2012-09-21 MED ORDER — LACTATED RINGERS IV SOLN: INTRAVENOUS | Status: DC

## 2012-09-21 MED ORDER — HYDROCOD POLST-CHLORPHEN POLST 10-8 MG/5ML PO LQCR
5.0000 mL | Freq: Two times a day (BID) | ORAL | Status: DC
  Administered 2012-09-21: 5 mL via ORAL
  Filled 2012-09-21: qty 5

## 2012-09-21 MED ORDER — LACTATED RINGERS IV SOLN
INTRAVENOUS | Status: DC
Start: 2012-09-21 — End: 2012-09-21

## 2012-09-21 MED ORDER — PHENYLEPHRINE 40 MCG/ML (10ML) SYRINGE FOR IV PUSH (FOR BLOOD PRESSURE SUPPORT)
80.0000 ug | PREFILLED_SYRINGE | INTRAVENOUS | Status: DC | PRN
  Filled 2012-09-21: qty 5

## 2012-09-21 MED ORDER — OXYTOCIN BOLUS FROM INFUSION
500.0000 mL | INTRAVENOUS | Status: DC
  Administered 2012-09-21: 500 mL via INTRAVENOUS

## 2012-09-21 MED ORDER — PRENATAL MULTIVITAMIN CH
1.0000 | ORAL_TABLET | Freq: Every day | ORAL | Status: DC
  Administered 2012-09-21 – 2012-09-23 (×3): 1 via ORAL
  Filled 2012-09-21 (×3): qty 1

## 2012-09-21 MED ORDER — FAMOTIDINE 20 MG PO TABS: 40.0000 mg | ORAL_TABLET | Freq: Once | ORAL | Status: DC

## 2012-09-21 MED ORDER — FLEET ENEMA 7-19 GM/118ML RE ENEM: 1.0000 | ENEMA | RECTAL | Status: DC | PRN

## 2012-09-21 MED ORDER — ONDANSETRON HCL 4 MG PO TABS: 4.0000 mg | ORAL_TABLET | ORAL | Status: DC | PRN

## 2012-09-21 MED ORDER — LACTATED RINGERS IV SOLN
INTRAVENOUS | Status: DC
  Administered 2012-09-22: 11:00:00 via INTRAVENOUS

## 2012-09-21 MED ORDER — TERBUTALINE SULFATE 1 MG/ML IJ SOLN: 0.2500 mg | Freq: Once | INTRAMUSCULAR | Status: DC | PRN

## 2012-09-21 MED ORDER — ZOLPIDEM TARTRATE 5 MG PO TABS: 5.0000 mg | ORAL_TABLET | Freq: Every evening | ORAL | Status: DC | PRN

## 2012-09-21 MED ORDER — OXYTOCIN 40 UNITS IN LACTATED RINGERS INFUSION - SIMPLE MED
1.0000 m[IU]/min | INTRAVENOUS | Status: DC
  Administered 2012-09-21: 1 m[IU]/min via INTRAVENOUS

## 2012-09-21 MED ORDER — ONDANSETRON HCL 4 MG/2ML IJ SOLN: 4.0000 mg | INTRAMUSCULAR | Status: DC | PRN

## 2012-09-21 MED ORDER — METOCLOPRAMIDE HCL 10 MG PO TABS: 10.0000 mg | ORAL_TABLET | Freq: Once | ORAL | Status: DC

## 2012-09-21 MED ORDER — LIDOCAINE HCL (PF) 1 % IJ SOLN
INTRAMUSCULAR | Status: DC | PRN
  Administered 2012-09-21 (×2): 4 mL

## 2012-09-21 MED ORDER — PHENYLEPHRINE 40 MCG/ML (10ML) SYRINGE FOR IV PUSH (FOR BLOOD PRESSURE SUPPORT): 80.0000 ug | PREFILLED_SYRINGE | INTRAVENOUS | Status: DC | PRN

## 2012-09-21 MED ORDER — DIPHENHYDRAMINE HCL 50 MG/ML IJ SOLN: 12.5000 mg | INTRAMUSCULAR | Status: DC | PRN

## 2012-09-21 NOTE — Anesthesia Procedure Notes (Signed)
Epidural Patient location during procedure: OB Start time: 09/21/2012 10:13 AM  Staffing Anesthesiologist: Meryem Haertel A. Performed by: anesthesiologist   Preanesthetic Checklist Completed: patient identified, site marked, surgical consent, pre-op evaluation, timeout performed, IV checked, risks and benefits discussed and monitors and equipment checked  Epidural Patient position: sitting Prep: site prepped and draped and DuraPrep Patient monitoring: continuous pulse ox and blood pressure Approach: midline Injection technique: LOR air  Needle:  Needle type: Tuohy  Needle gauge: 17 G Needle length: 9 cm and 9 Needle insertion depth: 7 cm Catheter type: closed end flexible Catheter size: 19 Gauge Catheter at skin depth: 12 cm Test dose: negative and Other  Assessment Events: blood not aspirated, injection not painful, no injection resistance, negative IV test and no paresthesia  Additional Notes Patient identified. Risks and benefits discussed including failed block, incomplete  Pain control, post dural puncture headache, nerve damage, paralysis, blood pressure Changes, nausea, vomiting, reactions to medications-both toxic and allergic and post Partum back pain. All questions were answered. Patient expressed understanding and wished to proceed. Sterile technique was used throughout procedure. Epidural site was Dressed with sterile barrier dressing. No paresthesias, signs of intravascular injection Or signs of intrathecal spread were encountered.  Patient was more comfortable after the epidural was dosed. Please see RN's note for documentation of vital signs and FHR which are stable.

## 2012-09-21 NOTE — Progress Notes (Signed)
  Subjective: Feeling some pressure, having more bloody show.  Pressure is mild/moderate at present.  Objective: BP 126/63  Pulse 108  Temp 98.3 F (36.8 C) (Oral)  Resp 20  Ht 5\' 4"  (1.626 m)  Wt 245 lb (111.131 kg)  BMI 42.05 kg/m2  SpO2 96%      FHT:  Reassuring, occasional mild variable, moderate variability UC:   q 3 min.  MVUs 200-250--had spaced out previously, with pitocin restarted at 1 mu/min. SVE:   Dilation: 8 Effacement (%): 70 Station: -2 Exam by:: Nigel Bridgeman CNM  Vtx much better applied to cervix  Assessment / Plan: Progressive labor Will CTO for increased pressure/urge to push.  Nigel Bridgeman 09/21/2012, 3:08 PM

## 2012-09-21 NOTE — Progress Notes (Signed)
Baby weight and measurements done and then infant back to mother skin to skin

## 2012-09-21 NOTE — Progress Notes (Signed)
Unable to get amnioinfusion to run on pump.  Vicki CN Minformed and orders received to d/c

## 2012-09-21 NOTE — Progress Notes (Signed)
  Subjective: Aware of contractions, but not uncomfortable at present.  Received cough med with benefit.  Objective: BP 123/76  Pulse 101  Temp 97.8 F (36.6 C) (Oral)  Resp 20  Ht 5\' 4"  (1.626 m)  Wt 245 lb (111.131 kg)  BMI 42.05 kg/m2      FHT: Reactive, with 2 episodes of variable decels with run of tripling UCs--pitocin on 2 mu/min. UC:  Regular, q 2-3 min, mild. SVE: Deferred     Assessment / Plan: Induction for postdates Hypocoiled cord GBS negative  Will CTO--hold pitocin at current level.  Madison Mays 09/21/2012, 9:00 AM

## 2012-09-21 NOTE — Progress Notes (Signed)
  Subjective: Comfortable with epidural.  Objective: BP 107/60  Pulse 100  Temp 98.4 F (36.9 C) (Oral)  Resp 20  Ht 5\' 4"  (1.626 m)  Wt 245 lb (111.131 kg)  BMI 42.05 kg/m2  SpO2 96%      FHT:  Series of mild variables with contractions, baseline 130s--+ response to scalp stim UC:  q 2-3 min Pitocin on 2 mu/min SVE:   Dilation: 4 Effacement (%): 60 Station: -3 Exam by:: Nigel Bridgeman CNM  Cervix very stretchy, vtx still -2, not well applied. IUPC placed.  Assessment / Plan: Progressive labor Amnioinfusion Ensure adequacy of labor.  Nigel Bridgeman 09/21/2012, 12:46 PM

## 2012-09-21 NOTE — Progress Notes (Signed)
  Subjective: Epidural placed--patient comfortable.  Thinks water just broke.  Objective: BP 126/72  Pulse 93  Temp 97.8 F (36.6 C) (Oral)  Resp 20  Ht 5\' 4"  (1.626 m)  Wt 245 lb (111.131 kg)  BMI 42.05 kg/m2  SpO2 96%      FHT:  Category 1 UC:   q 2-3 min Pitocin remains on 2 mu/min SVE:   Dilation: 4 Effacement (%): 60 Station: -3 Exam by:: Nigel Bridgeman CNM  Leaking clear fluid, but forebag noted. AROM--clear fluid Foley placed  Assessment / Plan: Induction for postdates, hypocoiled cord Progressive labor Will CTO--await further progress  Madison Mays 09/21/2012, 11:01 AM

## 2012-09-21 NOTE — Progress Notes (Signed)
  Subjective: Comfortable.  Called to see patient due to amnioinfusion not infusing and repetitive variables.  Objective: BP 103/55  Pulse 91  Temp 98.3 F (36.8 C) (Oral)  Resp 20  Ht 5\' 4"  (1.626 m)  Wt 245 lb (111.131 kg)  BMI 42.05 kg/m2  SpO2 96%      FHT:  Series of repetitive moderate variables, resolved with position change.  + scalp stim response. UC:   q 2-3 min, MVUs 250. Pitocin off with series of variables, now resolved. SVE:   Dilation: 5.5 Effacement (%): 70 Station: -2 Exam by:: Nigel Bridgeman CNM  More cervix on right than left--turned to left side to facilitate rotation/descent. FSE placed.   Assessment / Plan: Continue to observe at present Will consider restarting pitocin if MVUs decrease.  Nigel Bridgeman 09/21/2012, 2:16 PM

## 2012-09-21 NOTE — Progress Notes (Signed)
RN at bedside tracing FHR, and evaluating contractions and FHR.  Vivki CNM notified and orders received to d/c Pitocin and CNM would come and place FSE .

## 2012-09-21 NOTE — Anesthesia Preprocedure Evaluation (Signed)
Anesthesia Evaluation  Patient identified by MRN, date of birth, ID band Patient awake    Reviewed: Allergy & Precautions, H&P , Patient's Chart, lab work & pertinent test results  Airway Mallampati: III TM Distance: >3 FB Neck ROM: full    Dental No notable dental hx. (+) Teeth Intact   Pulmonary former smoker,  breath sounds clear to auscultation  Pulmonary exam normal       Cardiovascular negative cardio ROS  Rhythm:regular Rate:Normal     Neuro/Psych negative neurological ROS  negative psych ROS   GI/Hepatic negative GI ROS, Neg liver ROS,   Endo/Other  Morbid obesity  Renal/GU Hx/o Renal Calculi Hx/o Pyelonephritis  negative genitourinary   Musculoskeletal   Abdominal Normal abdominal exam  (+)   Peds  Hematology negative hematology ROS (+)   Anesthesia Other Findings   Reproductive/Obstetrics (+) Pregnancy                           Anesthesia Physical Anesthesia Plan  ASA: II  Anesthesia Plan: Epidural   Post-op Pain Management:    Induction:   Airway Management Planned:   Additional Equipment:   Intra-op Plan:   Post-operative Plan:   Informed Consent: I have reviewed the patients History and Physical, chart, labs and discussed the procedure including the risks, benefits and alternatives for the proposed anesthesia with the patient or authorized representative who has indicated his/her understanding and acceptance.     Plan Discussed with: Anesthesiologist  Anesthesia Plan Comments:         Anesthesia Quick Evaluation

## 2012-09-22 ENCOUNTER — Inpatient Hospital Stay (HOSPITAL_COMMUNITY): Payer: Medicaid Other | Admitting: Anesthesiology

## 2012-09-22 ENCOUNTER — Encounter (HOSPITAL_COMMUNITY)
Admission: RE | Disposition: A | Discharge status: Home / Self Care | Payer: Self-pay | Source: Ambulatory Visit | Attending: Obstetrics and Gynecology

## 2012-09-22 ENCOUNTER — Encounter (HOSPITAL_COMMUNITY): Payer: Self-pay | Admitting: Anesthesiology

## 2012-09-22 DIAGNOSIS — Z302 Encounter for sterilization: Secondary | ICD-10-CM

## 2012-09-22 HISTORY — PX: TUBAL LIGATION: SHX77

## 2012-09-22 LAB — SURGICAL PCR SCREEN: Staphylococcus aureus: POSITIVE — AB

## 2012-09-22 LAB — CBC
Hemoglobin: 10.7 g/dL — ABNORMAL LOW (ref 12.0–15.0)
MCH: 28.4 pg (ref 26.0–34.0)
Platelets: 241 10*3/uL (ref 150–400)
RBC: 3.77 MIL/uL — ABNORMAL LOW (ref 3.87–5.11)

## 2012-09-22 SURGERY — LIGATION, FALLOPIAN TUBE, POSTPARTUM
Anesthesia: Epidural | Site: Abdomen | Laterality: Bilateral | Wound class: Clean Contaminated

## 2012-09-22 MED ORDER — FENTANYL CITRATE 0.05 MG/ML IJ SOLN: 25.0000 ug | INTRAMUSCULAR | Status: DC | PRN

## 2012-09-22 MED ORDER — PROPOFOL INFUSION 10 MG/ML OPTIME
INTRAVENOUS | Status: DC | PRN
  Administered 2012-09-22 (×2): 2 mL via INTRAVENOUS
  Administered 2012-09-22: 1 mL via INTRAVENOUS
  Administered 2012-09-22 (×2): 2 mL via INTRAVENOUS
  Administered 2012-09-22: 1 mL via INTRAVENOUS

## 2012-09-22 MED ORDER — PROPOFOL 10 MG/ML IV EMUL
INTRAVENOUS | Status: AC
  Filled 2012-09-22: qty 20

## 2012-09-22 MED ORDER — FENTANYL CITRATE 0.05 MG/ML IJ SOLN
INTRAMUSCULAR | Status: DC | PRN
  Administered 2012-09-22 (×4): 50 ug via INTRAVENOUS

## 2012-09-22 MED ORDER — MIDAZOLAM HCL 2 MG/2ML IJ SOLN: 0.5000 mg | Freq: Once | INTRAMUSCULAR | Status: AC | PRN

## 2012-09-22 MED ORDER — BUPIVACAINE-EPINEPHRINE (PF) 0.5% -1:200000 IJ SOLN
INTRAMUSCULAR | Status: AC
  Filled 2012-09-22: qty 10

## 2012-09-22 MED ORDER — FENTANYL CITRATE 0.05 MG/ML IJ SOLN
INTRAMUSCULAR | Status: AC
  Filled 2012-09-22: qty 2

## 2012-09-22 MED ORDER — SODIUM BICARBONATE 8.4 % IV SOLN
INTRAVENOUS | Status: AC
  Filled 2012-09-22: qty 50

## 2012-09-22 MED ORDER — LIDOCAINE-EPINEPHRINE (PF) 2 %-1:200000 IJ SOLN
INTRAMUSCULAR | Status: AC
  Filled 2012-09-22: qty 20

## 2012-09-22 MED ORDER — ONDANSETRON HCL 4 MG/2ML IJ SOLN
INTRAMUSCULAR | Status: AC
  Filled 2012-09-22: qty 2

## 2012-09-22 MED ORDER — MIDAZOLAM HCL 2 MG/2ML IJ SOLN
INTRAMUSCULAR | Status: AC
  Filled 2012-09-22: qty 2

## 2012-09-22 MED ORDER — MEPERIDINE HCL 25 MG/ML IJ SOLN: 6.2500 mg | INTRAMUSCULAR | Status: DC | PRN

## 2012-09-22 MED ORDER — SODIUM BICARBONATE 8.4 % IV SOLN
INTRAVENOUS | Status: DC | PRN
  Administered 2012-09-22: 5 mL via EPIDURAL

## 2012-09-22 MED ORDER — LACTATED RINGERS IV SOLN
INTRAVENOUS | Status: DC | PRN
  Administered 2012-09-22: 20:00:00 via INTRAVENOUS

## 2012-09-22 MED ORDER — KETOROLAC TROMETHAMINE 30 MG/ML IJ SOLN
INTRAMUSCULAR | Status: DC | PRN
  Administered 2012-09-22: 30 mg via INTRAVENOUS

## 2012-09-22 MED ORDER — ONDANSETRON HCL 4 MG/2ML IJ SOLN
INTRAMUSCULAR | Status: DC | PRN
  Administered 2012-09-22: 4 mg via INTRAVENOUS

## 2012-09-22 MED ORDER — BUPIVACAINE-EPINEPHRINE 0.5% -1:200000 IJ SOLN
INTRAMUSCULAR | Status: DC | PRN
  Administered 2012-09-22: 4 mL
  Administered 2012-09-22: 6 mL

## 2012-09-22 MED ORDER — KETOROLAC TROMETHAMINE 30 MG/ML IJ SOLN
INTRAMUSCULAR | Status: AC
  Filled 2012-09-22: qty 1

## 2012-09-22 MED ORDER — MIDAZOLAM HCL 5 MG/5ML IJ SOLN
INTRAMUSCULAR | Status: DC | PRN
  Administered 2012-09-22 (×2): 2 mg via INTRAVENOUS

## 2012-09-22 MED ORDER — KETOROLAC TROMETHAMINE 30 MG/ML IJ SOLN: 15.0000 mg | Freq: Once | INTRAMUSCULAR | Status: AC | PRN

## 2012-09-22 MED ORDER — PROMETHAZINE HCL 25 MG/ML IJ SOLN: 6.2500 mg | INTRAMUSCULAR | Status: DC | PRN

## 2012-09-22 SURGICAL SUPPLY — 27 items
CHLORAPREP W/TINT 26ML (MISCELLANEOUS) ×2 IMPLANT
CLOTH BEACON ORANGE TIMEOUT ST (SAFETY) ×2 IMPLANT
CONTAINER PREFILL 10% NBF 15ML (MISCELLANEOUS) ×4 IMPLANT
DRSG COVADERM PLUS 2X2 (GAUZE/BANDAGES/DRESSINGS) ×2 IMPLANT
ELECT REM PT RETURN 9FT ADLT (ELECTROSURGICAL)
ELECTRODE REM PT RTRN 9FT ADLT (ELECTROSURGICAL) IMPLANT
GLOVE BIOGEL PI IND STRL 8.5 (GLOVE) ×1 IMPLANT
GLOVE BIOGEL PI INDICATOR 8.5 (GLOVE) ×1
GLOVE ECLIPSE 8.0 STRL XLNG CF (GLOVE) ×4 IMPLANT
GOWN PREVENTION PLUS LG XLONG (DISPOSABLE) ×2 IMPLANT
GOWN PREVENTION PLUS XXLARGE (GOWN DISPOSABLE) ×2 IMPLANT
NEEDLE HYPO 25X1 1.5 SAFETY (NEEDLE) ×2 IMPLANT
NS IRRIG 1000ML POUR BTL (IV SOLUTION) ×2 IMPLANT
PACK ABDOMINAL MINOR (CUSTOM PROCEDURE TRAY) ×2 IMPLANT
PENCIL BUTTON HOLSTER BLD 10FT (ELECTRODE) IMPLANT
SPONGE GAUZE 2X2 8PLY STRL LF (GAUZE/BANDAGES/DRESSINGS) ×2 IMPLANT
SPONGE LAP 4X18 X RAY DECT (DISPOSABLE) IMPLANT
SUT MNCRL AB 3-0 PS2 27 (SUTURE) ×2 IMPLANT
SUT PLAIN 0 NONE (SUTURE) ×2 IMPLANT
SUT VIC AB 2-0 CT1 27 (SUTURE) ×1
SUT VIC AB 2-0 CT1 TAPERPNT 27 (SUTURE) ×1 IMPLANT
SUT VIC AB 2-0 SH 27 (SUTURE) ×2
SUT VIC AB 2-0 SH 27XBRD (SUTURE) ×2 IMPLANT
SYR CONTROL 10ML LL (SYRINGE) ×2 IMPLANT
TOWEL OR 17X24 6PK STRL BLUE (TOWEL DISPOSABLE) ×4 IMPLANT
TRAY FOLEY CATH 14FR (SET/KITS/TRAYS/PACK) ×2 IMPLANT
WATER STERILE IRR 1000ML POUR (IV SOLUTION) IMPLANT

## 2012-09-22 NOTE — Anesthesia Postprocedure Evaluation (Signed)
Anesthesia Post Note  Patient: Madison Mays  Procedure(s) Performed: Procedure(s) (LRB): POST PARTUM TUBAL LIGATION (Bilateral)  Anesthesia type: Epidural  Patient location: PACU  Post pain: Pain level controlled  Post assessment: Post-op Vital signs reviewed  Last Vitals:  Filed Vitals:   09/22/12 2215  BP: 120/74  Pulse: 102  Temp:   Resp: 23    Post vital signs: Reviewed  Level of consciousness: awake  Complications: No apparent anesthesia complications

## 2012-09-22 NOTE — Progress Notes (Signed)
Post Partum Day 1 Subjective: up ad lib, voiding, tolerating PO, + flatus and NPO since MN (BTL scheduled for 1245); BF'ng.  s.o. at bs and holding newborn.  Report newborn cried a lot overnight, so little sleep.  VB lighter today.  No dizziness when OOB. c/o back pain.  Objective: Blood pressure 128/74, pulse 85, temperature 97.8 F (36.6 C), temperature source Oral, resp. rate 20, height 5\' 4"  (1.626 m), weight 245 lb (111.131 kg), SpO2 96.00%, unknown if currently breastfeeding.  Physical Exam:  General: alert, cooperative, appears stated age, no distress and moderately obese Lochia: appropriate, rubra Uterine Fundus: firm, U/-2 Incision: n/a DVT Evaluation: No evidence of DVT seen on physical exam. Negative Homan's sign. No significant calf/ankle edema.   Basename 09/22/12 0545 09/21/12 0740  HGB 10.7* 12.9  HCT 32.2* 37.6    Assessment/Plan: Plan for discharge tomorrow, Breastfeeding, Lactation consult and Contraception BTL today. Questions answered r/e tubal.     LOS: 1 day   Madison Mays H 09/22/2012, 8:08 AM

## 2012-09-22 NOTE — Progress Notes (Signed)
UR chart review completed.  

## 2012-09-22 NOTE — Transfer of Care (Signed)
Immediate Anesthesia Transfer of Care Note  Patient: Madison Mays  Procedure(s) Performed: Procedure(s) (LRB) with comments: POST PARTUM TUBAL LIGATION (Bilateral)  Patient Location: PACU  Anesthesia Type:Epidural  Level of Consciousness: awake, alert  and oriented  Airway & Oxygen Therapy: Patient Spontanous Breathing  Post-op Assessment: Report given to PACU RN and Post -op Vital signs reviewed and stable  Post vital signs: Reviewed and stable  Complications: No apparent anesthesia complications

## 2012-09-22 NOTE — Progress Notes (Signed)
The patient was interviewed and examined today.  The previously documented history and physical examination was reviewed. There are no changes. The operative procedure was reviewed. The risks and benefits were outlined again. The specific risks include, but are not limited to, anesthetic complications, bleeding, infections, and possible damage to the surrounding organs. The patient understands the risks of sterilization failure. She wants salpingectomy if appropriate. The patient's questions were answered.  We are ready to proceed as outlined. The likelihood of the patient achieving the goals of this procedure is very likely.   Leonard Schwartz, M.D.

## 2012-09-22 NOTE — Anesthesia Preprocedure Evaluation (Signed)
Anesthesia Evaluation  Patient identified by MRN, date of birth, ID band Patient awake    Reviewed: Allergy & Precautions, H&P , Patient's Chart, lab work & pertinent test results  Airway Mallampati: III TM Distance: >3 FB Neck ROM: full    Dental No notable dental hx. (+) Teeth Intact   Pulmonary former smoker,  breath sounds clear to auscultation  Pulmonary exam normal       Cardiovascular negative cardio ROS  Rhythm:regular Rate:Normal     Neuro/Psych negative neurological ROS  negative psych ROS   GI/Hepatic negative GI ROS, Neg liver ROS,   Endo/Other  Morbid obesity  Renal/GU Hx/o Renal Calculi Hx/o Pyelonephritis  negative genitourinary   Musculoskeletal   Abdominal Normal abdominal exam  (+)   Peds  Hematology negative hematology ROS (+)   Anesthesia Other Findings   Reproductive/Obstetrics                           Anesthesia Physical  Anesthesia Plan  ASA: II  Anesthesia Plan: Epidural   Post-op Pain Management:    Induction:   Airway Management Planned:   Additional Equipment:   Intra-op Plan:   Post-operative Plan:   Informed Consent: I have reviewed the patients History and Physical, chart, labs and discussed the procedure including the risks, benefits and alternatives for the proposed anesthesia with the patient or authorized representative who has indicated his/her understanding and acceptance.     Plan Discussed with: Anesthesiologist  Anesthesia Plan Comments:         Anesthesia Quick Evaluation

## 2012-09-22 NOTE — Op Note (Signed)
OPERATIVE NOTE   Madison Mays   DOB: 1986-07-01   MRN: [id number]   CSN: [id number]   Date of Surgery: 09/22/2012   Preoperative Diagnosis:   Postpartum day # 1  Desires Sterilization   Postoperative Diagnosis:   Same   Procedure:   Postpartum Bilateral Tubal Sterilization Procedure using Bilateral Salpingectomy  Surgeon:   Leonard Schwartz, M.D.   Assistant:   None   Anesthetic:   Epidural  Disposition:   The patient is a 26 y.o.-year-old female, now [id number], who desires sterilization. She understands the indications for surgical procedure. She accepts the risk of, but not limited to, anesthetic complications, bleeding, infections, possible damage to the surrounding organs, and possible tubal failure (17 per 1000). The patient has elected to have a bilateral salpingectomy if possible because of the potential benefit to reduce the risk of what was once thought to be ovarian cancer but may in fact be fallopian tube cancer.   Findings:   The fallopian tubes were normal bilaterally.   Procedure:  The patient was taken to the operating room where  the Epidural anesthesia was noted to be adequate for surgery. The patient's abdomen was prepped with multiple layers of ChloraPrep. A foley catheter was placed after sterile preparation. She was then sterilely draped. The subumbilical area was injected with 10 cc's of half percent Marcaine with epinephrine. A subumbilical incision was made and carried sharply through the subcutaneous tissue, the fascia, and the anterior peritoneum. The left fallopian tube was identified and followed to its fimbriated end. The mesosalpinx was clamped and cut. It was then suture ligated using 2-0 Vicryl and then free tied using 0 Vicryl. Hemostasis was adequate. An identical procedure was carried out on the opposite side. Again hemostasis was adequate. The fascia and the anterior peritoneum were closed using a running suture of 2-0 Vicryl. The  skin was reapproximated using a subcuticular suture of 3-0 Monocryl. Sponge, needle, and instrument counts were correct on 2 occasions. The estimated blood loss was 10 cc. The patient tolerated her procedure well. She was transported to the recovery room in stable condition. The cut portions of the fallopian tubes were sent to pathology.   Leonard Schwartz, M.D.

## 2012-09-23 MED ORDER — IBUPROFEN 600 MG PO TABS: 600.0000 mg | ORAL_TABLET | Freq: Four times a day (QID) | ORAL | Status: DC

## 2012-09-23 MED ORDER — OXYCODONE-ACETAMINOPHEN 5-325 MG PO TABS: 1.0000 | ORAL_TABLET | ORAL | Status: DC | PRN

## 2012-09-23 MED ORDER — AZITHROMYCIN 500 MG PO TABS: 500.0000 mg | ORAL_TABLET | Freq: Every day | ORAL | Status: DC

## 2012-09-23 NOTE — Discharge Summary (Signed)
Physician Discharge Summary  Patient ID: Madison Mays MRN: [id number] DOB/AGE: 12-17-1985 26 y.o.  Admit date: 09/21/2012 Discharge date: 09/23/2012  Admission Diagnoses: [redacted]w[redacted]d   Discharge Diagnoses:  Active Problems:  Vaginal delivery BTL Discharged Condition: stable  Hospital Course: [redacted]w[redacted]d induction postdates SVD, intact, BTL, lactating, normal involutin   Consults: None  Significant Diagnostic Studies: labs  Treatments: IV hydration  Discharge Exam: Blood pressure 108/74, pulse 91, temperature 98.7 F (37.1 C), temperature source Oral, resp. rate 18, height 5\' 4"  (1.626 m), weight 245 lb (111.131 kg), SpO2 98.00%, unknown if currently breastfeeding. S: comfortable, little bleeding, slept, passing gas, pain meds working well     breastfeeding O BP 108/74  Pulse 91  Temp 98.7 F (37.1 C) (Oral)  Resp 18  Ht 5\' 4"  (1.626 m)  Wt 245 lb (111.131 kg)  BMI 42.05 kg/m2  SpO2 98%  Breastfeeding? Unknown      Lungs clear bilaterally, AP RRR  Bowel sounds active     abd soft, nt, ff  Incision at U well approximated no redness, edema, or drainage     sm  Flow perineum clean intact     -Homans sign bilaterally,      No Edema    Component Value Date/Time   HGB 10.7* 09/22/2012 0545   HCT 32.2* 09/22/2012 0545   Disposition: 01-Home or Self Care     Medication List     As of 09/23/2012  8:49 AM    ASK your doctor about these medications         multivitamin-prenatal 27-0.8 MG Tabs   Take 1 tablet by mouth daily. OTC      pantoprazole 40 MG tablet   Commonly known as: PROTONIX   Take 1 tablet (40 mg total) by mouth daily.      reviewed s/s pp to report.   SignedLavera Guise 09/23/2012, 8:49 AM

## 2012-09-23 NOTE — Addendum Note (Signed)
Addendum  created 09/23/12 1009 by Gertie Fey, CRNA   Modules edited:Notes Section

## 2012-09-23 NOTE — Anesthesia Postprocedure Evaluation (Signed)
  Anesthesia Post-op Note  Patient: Madison Mays  Procedure(s) Performed: * No procedures listed *  Patient Location: Mother/Baby  Anesthesia Type:Epidural  Level of Consciousness: awake, alert  and oriented  Airway and Oxygen Therapy: Patient Spontanous Breathing  Post-op Pain: none  Post-op Assessment: Post-op Vital signs reviewed, Patient's Cardiovascular Status Stable, Respiratory Function Stable, Patent Airway, No signs of Nausea or vomiting, Pain level controlled, No headache, No backache, No residual numbness and No residual motor weakness  Post-op Vital Signs: Reviewed and stable  Complications: No apparent anesthesia complications

## 2012-09-23 NOTE — Anesthesia Postprocedure Evaluation (Signed)
  Anesthesia Post-op Note  Patient: Madison Mays  Procedure(s) Performed: Procedure(s) (LRB) with comments: POST PARTUM TUBAL LIGATION (Bilateral)  Patient Location: Mother/Baby  Anesthesia Type:Epidural  Level of Consciousness: awake, alert  and oriented  Airway and Oxygen Therapy: Patient Spontanous Breathing  Post-op Pain: none  Post-op Assessment: Post-op Vital signs reviewed  Post-op Vital Signs: Reviewed and stable  Complications: No apparent anesthesia complications

## 2012-09-24 ENCOUNTER — Encounter (HOSPITAL_COMMUNITY): Payer: Self-pay | Admitting: Obstetrics and Gynecology

## 2014-07-26 ENCOUNTER — Encounter (HOSPITAL_COMMUNITY): Payer: Self-pay | Admitting: Obstetrics and Gynecology

## 2015-09-21 ENCOUNTER — Emergency Department (HOSPITAL_BASED_OUTPATIENT_CLINIC_OR_DEPARTMENT_OTHER)
Admission: EM | Admit: 2015-09-21 | Discharge: 2015-09-22 | Disposition: A | Discharge status: Home / Self Care | Payer: Medicaid Other | Attending: Emergency Medicine | Admitting: Emergency Medicine

## 2015-09-21 ENCOUNTER — Encounter (HOSPITAL_BASED_OUTPATIENT_CLINIC_OR_DEPARTMENT_OTHER): Payer: Self-pay | Admitting: Emergency Medicine

## 2015-09-21 DIAGNOSIS — Z87891 Personal history of nicotine dependence: Secondary | ICD-10-CM | POA: Insufficient documentation

## 2015-09-21 DIAGNOSIS — Z87440 Personal history of urinary (tract) infections: Secondary | ICD-10-CM | POA: Insufficient documentation

## 2015-09-21 DIAGNOSIS — Z87442 Personal history of urinary calculi: Secondary | ICD-10-CM | POA: Insufficient documentation

## 2015-09-21 DIAGNOSIS — Z792 Long term (current) use of antibiotics: Secondary | ICD-10-CM | POA: Insufficient documentation

## 2015-09-21 DIAGNOSIS — Z8679 Personal history of other diseases of the circulatory system: Secondary | ICD-10-CM | POA: Insufficient documentation

## 2015-09-21 DIAGNOSIS — Z79899 Other long term (current) drug therapy: Secondary | ICD-10-CM | POA: Insufficient documentation

## 2015-09-21 DIAGNOSIS — N189 Chronic kidney disease, unspecified: Secondary | ICD-10-CM | POA: Insufficient documentation

## 2015-09-21 DIAGNOSIS — K0889 Other specified disorders of teeth and supporting structures: Secondary | ICD-10-CM

## 2015-09-21 DIAGNOSIS — K0381 Cracked tooth: Secondary | ICD-10-CM | POA: Insufficient documentation

## 2015-09-21 DIAGNOSIS — Z8619 Personal history of other infectious and parasitic diseases: Secondary | ICD-10-CM | POA: Insufficient documentation

## 2015-09-21 NOTE — ED Notes (Signed)
Patient states that she is having pain to her left mouth and pressure. Reports that she now has swelling.

## 2015-09-22 MED ORDER — NAPROXEN 500 MG PO TABS: 500.0000 mg | ORAL_TABLET | Freq: Two times a day (BID) | ORAL | Status: DC

## 2015-09-22 MED ORDER — CLINDAMYCIN HCL 150 MG PO CAPS: 300.0000 mg | ORAL_CAPSULE | Freq: Three times a day (TID) | ORAL | Status: AC

## 2015-09-22 NOTE — Discharge Instructions (Signed)
°Emergency Department Resource Guide °1) Find a Doctor and Pay Out of Pocket °Although you won't have to find out who is covered by your insurance plan, it is a good idea to ask around and get recommendations. You will then need to call the office and see if the doctor you have chosen will accept you as a new patient and what types of options they offer for patients who are self-pay. Some doctors offer discounts or will set up payment plans for their patients who do not have insurance, but you will need to ask so you aren't surprised when you get to your appointment. ° °2) Contact Your Local Health Department °Not all health departments have doctors that can see patients for sick visits, but many do, so it is worth a call to see if yours does. If you don't know where your local health department is, you can check in your phone book. The CDC also has a tool to help you locate your state's health department, and many state websites also have listings of all of their local health departments. ° °3) Find a Walk-in Clinic °If your illness is not likely to be very severe or complicated, you may want to try a walk in clinic. These are popping up all over the country in pharmacies, drugstores, and shopping centers. They're usually staffed by nurse practitioners or physician assistants that have been trained to treat common illnesses and complaints. They're usually fairly quick and inexpensive. However, if you have serious medical issues or chronic medical problems, these are probably not your best option. ° °No Primary Care Doctor: °- Call [facility] at  832-8000 - they can help you locate a primary care doctor that  accepts your insurance, provides certain services, etc. °- Physician Referral Service- 1-800-533-3463 ° °Chronic Pain Problems: °Organization         Address  Phone   Notes  °[facility]  (336) 297-2271 Patients need to be referred by their primary care doctor.  ° °Medication  Assistance: °Organization         Address  Phone   Notes  °[place] [facility] [address] °[place], [place] [address] (336) 641-8030 --Must be a resident of [place] °-- Must have NO insurance coverage whatsoever (no Medicaid/ Medicare, etc.) °-- The pt. MUST have a primary care doctor that directs their care regularly and follows them in the community °  °[facility]  (866) 331-1348   °[employer]  (888) 892-1162   ° °Agencies that provide inexpensive medical care: °Organization         Address  Phone   Notes  °[facility]  (336) 832-8035   °[facility]    (336) 832-7272   °[facility] [address] °[place], [place] [address] (336) 832-4777   °[facility] [address], °[place] (336) 271-4999   °[facility]    (336) 373-0678   °[facility]    (336) 272-1050   °[facility] ° [address], [place] Phone:  (336) 832-4444, Fax:  (336) 832-4440 Hours of Operation:  9 am - 6 pm, M-F.  Also accepts Medicaid/Medicare and self-pay.  °[facility] ° [address],  Phone: (336) 832-3150, Fax: (336) 832-3151. Hours of Operation:  8:30 am - 5:30 pm, M-F.  Also accepts Medicaid and self-pay.  °[facility] [place] [address], [place] Phone: (336) 878-6027   °[facility] [address], [place], [place] (336)723-1848, Ext. 123 Mondays & Thursdays: 7-9 AM.  First 15 patients are seen on a first come, first serve basis. °  ° °Medicaid-accepting [place] Providers: ° °Organization         Address  Phone   Notes  °[facility] [address], [place] (336) 641-2100 Also accepts self-pay patients.  °[facility] [address], [place] ° (336) 856-9996   °[facility] [address], [place]  (336) 288-8857   °[facility] [address] [place] [address], [place] (336) 299-7000   °Veita Bland [address], [place]  ° (336) 373-1557 Only accepts [place] Access Medicaid patients after they have their name applied to their card.  ° °Self-Pay (no insurance) in [place]: ° °Organization         Address  Phone   Notes  °Sickle Cell Patients, [facility] [address], [place] (336) 832-1970   °[facility] [address], [place] (336) 832-4400   °[facility] ° [address], [place] (336) 992-4800   °[facility]/Dr. Osei-Bonsu ° [address] [place] [address], [place] or [address], [place] (336) 841-8500 Phone number for both [place] and [place] locations is the same.  °[facility] [address], [place] (336) 299-0000   °[facility] [address] [place] [address], [place] or [address] (336) 852-7530 °(336) 878-2260   °[facility] [address], [place] (336) 350-1642, phone; (336) 294-5005, fax Sees patients 1st and 3rd Saturday of every month.  Must not qualify for public or private insurance (i.e. Medicaid, Medicare, [place] Health Choice, Veterans' Benefits) • Household income should be no more than 200% of the poverty level •The clinic cannot treat you if you are pregnant or think you are pregnant • Sexually transmitted diseases are not treated at the clinic.  ° ° °Dental Care: °Organization         Address  Phone  Notes  °[place] [facility] [address],  (336) 641-6152 Accepts children up to age 21 who are enrolled in Medicaid or [place] Health Choice; pregnant women with a Medicaid card; and children who have applied for Medicaid or [place] Health Choice, but were declined, whose parents can pay a reduced fee at time of service.  °[place]  [facility] [place]  [address], [place] (336) 641-7733 Accepts children up to age 21 who are enrolled in Medicaid or [place] Health Choice; pregnant women with a Medicaid card; and children who have applied for Medicaid or  Health Choice, but were declined, whose parents can pay a reduced fee at time of service.  °[facility] ° [address],  (336) 641-4533 Patients are seen by appointment only. Walk-ins are not accepted. [facility] will see patients 18 years of age and older. °Monday - Tuesday (8am-5pm) °Most Wednesdays (8:30-5pm) °$30 per visit, cash only  °[facility] ° [address], [place] (336) 641-4533 Patients are seen by appointment only. Walk-ins are not accepted. [facility] will see patients 18 years of age and older. °One   Wednesday Evening (Monthly: Volunteer Based).  $30 per visit, cash only  °[facility]  (919) 537-3737 for adults; Children under age 4, call Graduate Pediatric Dentistry at (919) [phone]. Children aged 4-14, please call (919) [phone] to request a pediatric application. ° Dental services are provided in all areas of dental care including fillings, crowns and bridges, complete and partial dentures, implants, gum treatment, root canals, and extractions. Preventive care is also provided. Treatment is provided to both adults and children. °Patients are selected via a lottery and there is often a waiting list. °  °[facility] [address], °[place] ° (336) 763-8833 [email or web address] °  °[facility] [address], [place], [place] (336)723-1848, Ext. 123 Second and Fourth Thursday of each month, opens at 6:30 AM; Clinic ends at 9 AM.  Patients are seen on a first-come first-served basis, and a limited number are seen during each clinic.  ° °[facility] ° [address], [place], [place] (336) 723-7904    Eligibility Requirements °You must have lived in [place], [place], or [place] counties for at least the last three months. °  You cannot be eligible for state or federal sponsored healthcare insurance, including Veterans Administration, Medicaid, or Medicare. °  You generally cannot be eligible for healthcare insurance through your employer.  °  How to apply: °Eligibility screenings are held every Tuesday and Wednesday afternoon from 1:00 pm until 4:00 pm. You do not need an appointment for the interview!  °[facility] [address], [place], [place] 336-631-2330   °[facility] [facility]  336-342-8273   °[facility]  336-703-3100   °[facility]  336-570-6415   ° °Behavioral Health Resources in the Community: °Intensive Outpatient Programs °Organization         Address  Phone  Notes  °[facility] [address], [place], [place] 336-878-6098   °[facility] [address], [place], [place] 336-832-9800   °[facility] [address], [place], [place] ° 336-882-2125   °[facility] [address],  °[place], [place] 1-800-853-5163 or 336-641-4981   °Substance Abuse Resources °Organization         Address  Phone  Notes  °[facility]  336-882-2125   °[facility]  336-784-9470   °[facility]  336-285-9073   °[facility]  336-845-3988   °Residential & Outpatient Substance Abuse Program  1-800-659-3381   °Psychological Services °Organization         Address  Phone  Notes  °[facility]  336- 832-9600   °[facility]  336- 378-7881   °[facility] [address], [place] 1-800-853-5163 or 336-641-4981   ° °Mobile Crisis Teams °Organization         Address  Phone  Notes  °[facility], Mobile Crisis Care Unit  1-877-626-1772   °Assertive °[facility] ° [address]  [place], [place] 336-834-9664   °[facility] DeEsch [address] °[place] 336-554-5454   ° °Self-Help/Support Groups °Organization         Address  Phone             Notes  °[facility]  - variety of support groups  336- 373-1402 Call for more information  °Narcotics Anonymous (NA), [facility] [address], °[place]  2 meetings at this location  ° °  Residential Treatment Programs °Organization         Address  Phone  Notes  °[facility] [address],    °[place]  1-866-801-8205   °[facility] ° [address], [place], [place] 704-293-8524   °[facility] [address], [place] 336-845-3988 Admissions: 8am-3pm M-F  °[facility] [address]    °[place], [place] 336-841-1104   °[facility] [address], [place], [place] 336-379-7146   °[facility] [address]  °[place], [place] 336-285-9073   °[facility] - Intensive Outpatient [address], [place], [place] 336-852-3033   °[facility] [address]  °[place], [place] 1-877-615-2722 or 336-784-9470   °[facility] [address] [place], [place] 336-227-7417 Accepts Medicaid  °[facility] [address]  °[place] 1-800-659-3381 Substance Abuse/Addiction Treatment  ° °[place] Behavioral Health Resources °Organization         Address  Phone  Notes  °[facility]  (888) 581-9988   °Julie Brannon, PhD [address] [place], [place]   (336) 349-5553 or (336) 951-0000   °[facility]   [address] °[place], [place] (336) 349-4454   °[facility] [address], [place], [place] (336) 342-8316 Insurance/Medicaid/sponsorship through [facility]  °[facility] [address]                                    [place], [place] (336) 342-8316 Therapy/tele-psych/case    °[facility] [address]  ° [place], [place] (336) 349-2233    °Dr. Arfeen  (336) 349-4544   °[facility] [place]  [employer] [place] [facility] 1) [address],  °2) [address], [place] °3)  [address], [place] (336) 349-3220 °(336) 342-7768 ° °(336) 342-8140   °[place] [facility] (336) 342-1394 or (336) 342-3537 (After Hours)    ° ° °

## 2015-09-22 NOTE — ED Provider Notes (Signed)
CSN: [id number]     Arrival date & time 09/21/15  2258 History   First MD Initiated Contact with Patient 09/22/15 0033     Chief Complaint  Patient presents with  . Dental Pain     (Consider location/radiation/quality/duration/timing/severity/associated sxs/prior Treatment) Patient is a 29 y.o. female presenting with tooth pain.  Dental Pain Location:  Lower Duration:  4 days Timing:  Constant Progression:  Worsening Chronicity:  Recurrent Ineffective treatments:  NSAIDs Associated symptoms: facial swelling   Associated symptoms: no fever, no headaches and no neck pain     Past Medical History  Diagnosis Date  . H/O bladder infections   . Abnormal Pap smear 2009    COLPO; ;LAST PAP 2009  . Varicose veins 2008  . Anemia     WITH PREGNANCY 2004  . Infection 02/2009    UTI  . Infection     YEAST X 1  . Infection 2009    PYELO WITH PREG  . Chronic kidney disease 2010    KIDNEY STONE  . Yeast infection   . H/O varicella   . Kidney infection   . 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 No past surgeries    . Tubal ligation  09/22/2012    Procedure: POST PARTUM TUBAL LIGATION;  Surgeon: Kirkland HunArthur Stringer, MD;  Location: [facility];  Service: Gynecology;  Laterality: Bilateral;   Family History  Problem Relation Age of Onset  . Anesthesia problems Neg Hx   . Other Mother     VARICOSE VEINS  . Diabetes Father   . Hypertension Father   . Other Son     ARTHROGRYPOSIS  . Vision loss Son     DUANE'S SYNDROME  . Cancer Maternal Grandfather     LUNG  . Diabetes Paternal Grandmother   . Cancer Paternal Grandfather     LUNG   Social History  Substance Use Topics  . Smoking status: Former Smoker    Quit date: 09/24/2005  . Smokeless tobacco: Never Used  . Alcohol Use: No   OB History    Gravida Para Term Preterm AB TAB SAB Ectopic Multiple Living   4 4 4  0  0 0 0 0 0 4     Review of Systems  Constitutional: Negative for fever.  HENT: Positive for dental problem and facial swelling. Negative for sore throat.   Eyes: Negative for visual disturbance.  Respiratory: Negative for cough and shortness of breath.   Cardiovascular: Negative for chest pain.  Gastrointestinal: Negative for abdominal pain.  Genitourinary: Negative for difficulty urinating.  Musculoskeletal: Negative for back pain and neck pain.  Skin: Negative for rash.  Neurological: Negative for syncope and headaches.      Allergies  Review of patient's allergies indicates no known allergies.  Home Medications   Prior to Admission medications   Medication Sig Start Date End Date Taking? Authorizing Provider  azithromycin (ZITHROMAX) 500 MG tablet Take 1 tablet (500 mg total) by mouth daily. 09/23/12   Lavera GuiseMary Krebsbach, CNM  clindamycin (CLEOCIN) 150 MG capsule Take 2 capsules (300 mg total) by mouth 3 (three) times daily. 09/22/15 09/29/15  Alvira MondayErin Atziri Zubiate, MD  ibuprofen (ADVIL,MOTRIN) 600 MG tablet Take 1 tablet (600 mg total) by mouth every 6 (six) hours. 09/23/12   Lavera GuiseMary Krebsbach, CNM  naproxen (NAPROSYN) 500 MG tablet Take 1 tablet (500 mg total) by  mouth 2 (two) times daily with a meal. 09/22/15   Alvira Monday, MD  oxyCODONE-acetaminophen (PERCOCET/ROXICET) 5-325 MG per tablet Take 1-2 tablets by mouth every 4 (four) hours as needed (moderate - severe pain). 09/23/12   Lavera Guise, CNM  pantoprazole (PROTONIX) 40 MG tablet Take 1 tablet (40 mg total) by mouth daily. 09/10/12   Silverio Lay, MD  Prenatal Vit-Fe Fumarate-FA (MULTIVITAMIN-PRENATAL) 27-0.8 MG TABS Take 1 tablet by mouth daily. OTC    Historical Provider, MD   BP 124/74 mmHg  Pulse 101  Temp(Src) 98.8 F (37.1 C) (Oral)  Resp 16  Ht  (1.626 m)  Wt 250 lb (113.399 kg)  BMI 42.89 kg/m2  SpO2 98%  LMP 07/31/2015 (Approximate) Physical Exam  Constitutional: She is oriented to person, place, and  time. She appears well-developed and well-nourished. No distress.  HENT:  Head: Normocephalic and atraumatic.  Mouth/Throat:    24 cracked through middle (chronic per pt) 23 mildly loose  No palpable abscess No sublingual swelling Normal ROM of jaw  Eyes: Conjunctivae and EOM are normal.  Neck: Normal range of motion.  Cardiovascular: Normal rate, regular rhythm, normal heart sounds and intact distal pulses.  Exam reveals no gallop and no friction rub.   No murmur heard. Pulmonary/Chest: Effort normal and breath sounds normal. No respiratory distress. She has no wheezes. She has no rales.  Abdominal: Soft. She exhibits no distension. There is no tenderness. There is no guarding.  Musculoskeletal: She exhibits no edema or tenderness.  Neurological: She is alert and oriented to person, place, and time.  Skin: Skin is warm and dry. No rash noted. She is not diaphoretic. No erythema.  Nursing note and vitals reviewed.   ED Course  Procedures (including critical care time) Labs Review Labs Reviewed - No data to display  Imaging Review No results found. I have personally reviewed and evaluated these images and lab results as part of my medical decision-making.   EKG Interpretation None      MDM   Final diagnoses:  Pain, dental, possible periapical abscess   29yo female with history of dental problems presents with concern for dental pain, facial swelling, tooth loosening. Denies trauma. No sign of Ludwig's angina, no abscess amenable to drainage. No fevers.  No sign of parotitis/jaw osteomyelitis.  Possible periapical abscess given increased pain and mild surrounding facial swelling.  Gave rx for clindamycin and recommend close dental follow up. Given naproxen rx. Patient discharged in stable condition with understanding of reasons to return.     Alvira Monday, MD 09/22/15 [phone]

## 2015-10-12 ENCOUNTER — Emergency Department (HOSPITAL_BASED_OUTPATIENT_CLINIC_OR_DEPARTMENT_OTHER)
Admission: EM | Admit: 2015-10-12 | Discharge: 2015-10-12 | Disposition: A | Discharge status: Home / Self Care | Payer: Medicaid Other | Attending: Emergency Medicine | Admitting: Emergency Medicine

## 2015-10-12 ENCOUNTER — Encounter (HOSPITAL_BASED_OUTPATIENT_CLINIC_OR_DEPARTMENT_OTHER): Payer: Self-pay

## 2015-10-12 DIAGNOSIS — Z87891 Personal history of nicotine dependence: Secondary | ICD-10-CM | POA: Insufficient documentation

## 2015-10-12 DIAGNOSIS — Z8679 Personal history of other diseases of the circulatory system: Secondary | ICD-10-CM | POA: Insufficient documentation

## 2015-10-12 DIAGNOSIS — Z87442 Personal history of urinary calculi: Secondary | ICD-10-CM | POA: Insufficient documentation

## 2015-10-12 DIAGNOSIS — R109 Unspecified abdominal pain: Secondary | ICD-10-CM | POA: Insufficient documentation

## 2015-10-12 DIAGNOSIS — Z3202 Encounter for pregnancy test, result negative: Secondary | ICD-10-CM | POA: Insufficient documentation

## 2015-10-12 DIAGNOSIS — Z8742 Personal history of other diseases of the female genital tract: Secondary | ICD-10-CM | POA: Insufficient documentation

## 2015-10-12 DIAGNOSIS — Z8619 Personal history of other infectious and parasitic diseases: Secondary | ICD-10-CM | POA: Insufficient documentation

## 2015-10-12 DIAGNOSIS — R197 Diarrhea, unspecified: Secondary | ICD-10-CM | POA: Insufficient documentation

## 2015-10-12 DIAGNOSIS — N189 Chronic kidney disease, unspecified: Secondary | ICD-10-CM | POA: Insufficient documentation

## 2015-10-12 LAB — CBC WITH DIFFERENTIAL/PLATELET
Basophils Absolute: 0.1 10*3/uL (ref 0.0–0.1)
Basophils Relative: 1 %
EOS PCT: 4 %
Eosinophils Absolute: 0.4 10*3/uL (ref 0.0–0.7)
HEMATOCRIT: 43.1 % (ref 36.0–46.0)
HEMOGLOBIN: 14.6 g/dL (ref 12.0–15.0)
LYMPHS ABS: 2.7 10*3/uL (ref 0.7–4.0)
LYMPHS PCT: 29 %
MCH: 28.9 pg (ref 26.0–34.0)
MCHC: 33.9 g/dL (ref 30.0–36.0)
MCV: 85.2 fL (ref 78.0–100.0)
Monocytes Absolute: 0.7 10*3/uL (ref 0.1–1.0)
Monocytes Relative: 7 %
NEUTROS ABS: 5.5 10*3/uL (ref 1.7–7.7)
Neutrophils Relative %: 59 %
PLATELETS: 360 10*3/uL (ref 150–400)
RBC: 5.06 MIL/uL (ref 3.87–5.11)
RDW: 12.6 % (ref 11.5–15.5)
WBC: 9.3 10*3/uL (ref 4.0–10.5)

## 2015-10-12 LAB — URINALYSIS, ROUTINE W REFLEX MICROSCOPIC
BILIRUBIN URINE: NEGATIVE
GLUCOSE, UA: NEGATIVE mg/dL
KETONES UR: NEGATIVE mg/dL
Leukocytes, UA: NEGATIVE
Nitrite: NEGATIVE
PROTEIN: NEGATIVE mg/dL
Specific Gravity, Urine: 1.022 (ref 1.005–1.030)
pH: 6 (ref 5.0–8.0)

## 2015-10-12 LAB — BASIC METABOLIC PANEL
ANION GAP: 8 (ref 5–15)
BUN: 10 mg/dL (ref 6–20)
CHLORIDE: 105 mmol/L (ref 101–111)
CO2: 25 mmol/L (ref 22–32)
Calcium: 8.8 mg/dL — ABNORMAL LOW (ref 8.9–10.3)
Creatinine, Ser: 0.6 mg/dL (ref 0.44–1.00)
GFR calc Af Amer: >60 mL/min (ref >60)
GFR calc non Af Amer: >60 mL/min (ref >60)
GLUCOSE: 109 mg/dL — AB (ref 65–99)
POTASSIUM: 4.1 mmol/L (ref 3.5–5.1)
Sodium: 138 mmol/L (ref 135–145)

## 2015-10-12 LAB — URINE MICROSCOPIC-ADD ON

## 2015-10-12 LAB — PREGNANCY, URINE: Preg Test, Ur: NEGATIVE

## 2015-10-12 MED ORDER — SODIUM CHLORIDE 0.9 % IV BOLUS (SEPSIS)
1000.0000 mL | Freq: Once | INTRAVENOUS | Status: AC
  Administered 2015-10-12: 1000 mL via INTRAVENOUS

## 2015-10-12 NOTE — ED Provider Notes (Signed)
CSN: [id number]     Arrival date & time 10/12/15  1107 History   First MD Initiated Contact with Patient 10/12/15 1203     Chief Complaint  Patient presents with  . Abdominal Pain    HPI  Madison Mays is an 30 y.o. female who presents to the ED for evaluation of diarrhea and abdominal pain. She states that she was prescribed clindamycin for a dental infection on 09/22/15. States she finished the course about two weeks ago. She states that while she was taking the antibiotic she had intermittent abdominal pain and diarrhea. She is here today because she continues to have intermittent abdominal pain and diarrhea since d/c'ing the clinda. She reports ~3 episodes of loose stools daily. Denies BRBPR or black, tarry stools. She states that sometimes her BM are watery. States that they are not particularly mal-odorous. She reports associated mild abdominal cramping prior to her BM that is alleviated after BM. She states she has continued to take probiotics since being on the clindamycin and states sometimes her stools are soft/regular but then they will return to being loose. She states she is here because she is concerned about possible c. Diff.   Past Medical History  Diagnosis Date  . H/O bladder infections   . Abnormal Pap smear 2009    COLPO; ;LAST PAP 2009  . Varicose veins 2008  . Anemia     WITH PREGNANCY 2004  . Infection 02/2009    UTI  . Infection     YEAST X 1  . Infection 2009    PYELO WITH PREG  . Chronic kidney disease 2010    KIDNEY STONE  . Yeast infection   . H/O varicella   . Kidney infection   . 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 No past surgeries    . Tubal ligation  09/22/2012    Procedure: POST PARTUM TUBAL LIGATION;  Surgeon: Kirkland Hun, MD;  Location: [facility];  Service: Gynecology;  Laterality: Bilateral;   Family History  Problem Relation Age  of Onset  . Anesthesia problems Neg Hx   . Other Mother     VARICOSE VEINS  . Diabetes Father   . Hypertension Father   . Other Son     ARTHROGRYPOSIS  . Vision loss Son     DUANE'S SYNDROME  . Cancer Maternal Grandfather     LUNG  . Diabetes Paternal Grandmother   . Cancer Paternal Grandfather     LUNG   Social History  Substance Use Topics  . Smoking status: Former Smoker    Quit date: 09/24/2005  . Smokeless tobacco: Never Used  . Alcohol Use: No   OB History    Gravida Para Term Preterm AB TAB SAB Ectopic Multiple Living   0 0 0 0 0 0 4     Review of Systems  All other systems reviewed and are negative.     Allergies  Review of patient's allergies indicates no known allergies.  Home Medications   Prior to Admission medications   Not on File   BP 125/83 mmHg  Pulse 98  Temp(Src) 98.2 F (36.8 C) (Oral)  Resp 18  Ht  (1.626 m)  Wt 113.399 kg  BMI 42.89 kg/m2  SpO2 99%  LMP 09/08/2015 Physical Exam  Constitutional: She is oriented to person, place, and  time.  HENT:  Right Ear: External ear normal.  Left Ear: External ear normal.  Nose: Nose normal.  Mouth/Throat: Oropharynx is clear and moist. Mucous membranes are dry. No oropharyngeal exudate.  Eyes: Conjunctivae and EOM are normal. Pupils are equal, round, and reactive to light.  Neck: Normal range of motion. Neck supple.  Cardiovascular: Normal rate, regular rhythm, normal heart sounds and intact distal pulses.   Pulmonary/Chest: Effort normal and breath sounds normal. No respiratory distress. She has no wheezes. She exhibits no tenderness.  Abdominal: Soft. Bowel sounds are normal. She exhibits no distension. There is no tenderness. There is no rebound and no guarding.  Musculoskeletal: She exhibits no edema.  Neurological: She is alert and oriented to person, place, and time. No cranial nerve deficit.  Skin: Skin is warm and dry. No pallor.  Psychiatric: She has a normal mood and  affect.  Nursing note and vitals reviewed.   ED Course  Procedures (including critical care time) Labs Review Labs Reviewed  URINALYSIS, ROUTINE W REFLEX MICROSCOPIC (NOT AT [facility]) - Abnormal; Notable for the following:    APPearance CLOUDY (*)    Hgb urine dipstick TRACE (*)    All other components within normal limits  URINE MICROSCOPIC-ADD ON - Abnormal; Notable for the following:    Squamous Epithelial / LPF 6-30 (*)    Bacteria, UA FEW (*)    All other components within normal limits  BASIC METABOLIC PANEL - Abnormal; Notable for the following:    Glucose, Bld 109 (*)    Calcium 8.8 (*)    All other components within normal limits  C DIFFICILE QUICK SCREEN W PCR REFLEX  PREGNANCY, URINE  CBC WITH DIFFERENTIAL/PLATELET    Imaging Review No results found. I have personally reviewed and evaluated these images and lab results as part of my medical decision-making.   EKG Interpretation None      MDM   Final diagnoses:  Diarrhea, unspecified type    Discussed with pt that I have low suspicion for c.diff given description of stools and pt's clinical presentation, as well as timing since antibiotic completion. However, will send off c.diff quick scan. Discussed with pt that this is a send[facility] lab to [facility] and might not receive results today. She states that is fine and we can call her if positive with call-in rx for flagyl. Will check basic labs to r/o metabolic derangement or signs of infection. UA obtained in traige unremarkable and has e/o contamination. Pt otherwise has nonfocal exam. She is completely nontender on her abdominal exam with no peritoneal signs so will hold off on CT abd/pelvis for now. Will give 1L NS bolus as pt does look slightly dry.    CBC and BMP unremarkable. Pt requesting to leave as she needs to pick up her kids. Will d/c as above. C. Diff pending. GI referral given. PCP f/u instructed. ER return precautions given.   Carlene Coria, PA-C 10/12/15  1405  Rolland Porter, MD 10/18/15 [phone]

## 2015-10-12 NOTE — ED Notes (Signed)
C/o abd pain and diarrhea since starting abx for toothache

## 2015-10-12 NOTE — Discharge Instructions (Signed)
You were seen in the ER today for evaluation of diarrhea. Your labs so far were normal. We sent off a sample to test for C. Diff. We will call you if it comes back positive. In the meantime you may continue your probiotics if you find them helpful at home. Please follow up with your primary care provider within one week. If you do not have one contact one from the list below. I have also given you the contact information for GI. Please call them to schedule an appointment if your symptoms persist or do not improve.  Take medications as prescribed. Return to the emergency room for worsening condition or new concerning symptoms. Follow up with your regular doctor. If you don't have a regular doctor use one of the numbers below to establish a primary care doctor.   Emergency Department Resource Guide 1) Find a Doctor and Pay Out of Pocket Although you won't have to find out who is covered by your insurance plan, it is a good idea to ask around and get recommendations. You will then need to call the office and see if the doctor you have chosen will accept you as a new patient and what types of options they offer for patients who are self-pay. Some doctors offer discounts or will set up payment plans for their patients who do not have insurance, but you will need to ask so you aren't surprised when you get to your appointment.  2) Contact Your Local Health Department Not all health departments have doctors that can see patients for sick visits, but many do, so it is worth a call to see if yours does. If you don't know where your local health department is, you can check in your phone book. The CDC also has a tool to help you locate your state's health department, and many state websites also have listings of all of their local health departments.  3) Find a Walk-in Clinic If your illness is not likely to be very severe or complicated, you may want to try a walk in clinic. These are popping up all over the  country in pharmacies, drugstores, and shopping centers. They're usually staffed by nurse practitioners or physician assistants that have been trained to treat common illnesses and complaints. They're usually fairly quick and inexpensive. However, if you have serious medical issues or chronic medical problems, these are probably not your best option.  No Primary Care Doctor: - Call Health Connect at  [phone] - they can help you locate a primary care doctor that  accepts your insurance, provides certain services, etc. - Physician Referral [phone]  Emergency Department Resource Guide 1) Find a Doctor and Pay Out of Pocket Although you won't have to find out who is covered by your insurance plan, it is a good idea to ask around and get recommendations. You will then need to call the office and see if the doctor you have chosen will accept you as a new patient and what types of options they offer for patients who are self-pay. Some doctors offer discounts or will set up payment plans for their patients who do not have insurance, but you will need to ask so you aren't surprised when you get to your appointment.  2) Contact Your Local Health Department Not all health departments have doctors that can see patients for sick visits, but many do, so it is worth a call to see if yours does. If you don't know where your local health  department is, you can check in your phone book. The CDC also has a tool to help you locate your state's health department, and many state websites also have listings of all of their local health departments.  3) Find a Walk-in Clinic If your illness is not likely to be very severe or complicated, you may want to try a walk in clinic. These are popping up all over the country in pharmacies, drugstores, and shopping centers. They're usually staffed by nurse practitioners or physician assistants that have been trained to treat common illnesses and complaints. They're  usually fairly quick and inexpensive. However, if you have serious medical issues or chronic medical problems, these are probably not your best option.  No Primary Care Doctor: - Call Health Connect at  [phone] - they can help you locate a primary care doctor that  accepts your insurance, provides certain services, etc. - Physician Referral Service- [phone]  Chronic Pain Problems: Organization         Address  Phone   Notes  [facility]  [phone] Patients need to be referred by their primary care doctor.   Medication Assistance: Organization         Address  Phone   Notes  [facility] Medication [facility] [address] [place], [place] [address] [phone] --Must be a resident of [facility] -- Must have NO insurance coverage whatsoever (no Medicaid/ Medicare, etc.) -- The pt. MUST have a primary care doctor that directs their care regularly and follows them in the community   [facility]  [phone]   [employer]  [phone]    Agencies that provide inexpensive medical care: Organization         Address  Phone   Notes  [facility]  [phone]   [facility]    [phone]   [facility] [address] [place], [place] [address] [phone]   [facility] [address], [place] [phone]   [facility]    [phone]   [facility]    [phone]   [facility]  [address], [place] Phone:  [phone], Fax:  [phone] Hours of Operation:  9 am - 6 pm, M-F.  Also accepts Medicaid/Medicare and self-pay.  [facility]  [address], [place] Phone: [phone], Fax: [phone]. Hours of Operation:  8:30 am - 5:30 pm, M-F.  Also accepts Medicaid and self-pay.  [facility] [place] [address], [place] Phone:  [phone]   [facility] [address] Natasha Bence [place], [place] [phone], Ext. [phone] Mondays & Thursdays: 7-9 AM.  First 15 patients are seen on a first come, first serve basis.    Medicaid-accepting [facility] Providers:  Organization         Address  Phone   Notes  [facility] [address], [place] [phone] Also accepts self-pay patients.  [facility] [address] Laurell Josephs [place], [place]  [phone]   [facility] [address], [place] [phone]   [facility] [address] [place] [phone]   Renaye Rakers [address], [place]   [phone] Only accepts [place] patients after they have  their name applied to their card.   Self-Pay (no insurance) in [facility]:  Organization         Address  Phone   Notes  Sickle Cell Patients, [facility] [address] [place], [place] [phone]   [facility] [address] [place], [place] [phone]   [facility] [place]  [address], [place] [phone]   [facility]/Dr. Osei-Bonsu  [address], [place] or [address], [place] [phone] Phone number for both [place] and [place] locations is the same.  [facility] [address] [place] [phone]   [facility] [address], [place] or [address] [phone]   [facility] [address], [place] [phone], phone; [phone], fax Sees patients 1st and 3rd Saturday of every month.  Must not qualify for public or private insurance (i.e. Medicaid, Medicare, [place] Health Choice, Veterans' Benefits)  Household income should be no more than 200% of the poverty level The clinic cannot treat  you if you are pregnant or think you are pregnant  Sexually transmitted diseases are not treated at the clinic.     Chronic Diarrhea Diarrhea is frequent loose and watery bowel movements. It can cause you to feel weak and dehydrated. Dehydration can cause you to become tired and thirsty and to have a dry mouth, decreased urination, and dark yellow urine. Diarrhea is a sign of another problem, most often an infection that will not last long. In most cases, diarrhea lasts 2-3 days. Diarrhea that lasts longer than 4 weeks is called long-lasting (chronic) diarrhea. It is important to treat your diarrhea as directed by your health care provider to lessen or prevent future episodes of diarrhea.  CAUSES  There are many causes of chronic diarrhea. The following are some possible causes:   Gastrointestinal infections caused by viruses, bacteria, or parasites.   Food poisoning or food allergies.   Certain medicines, such as antibiotics, chemotherapy, and laxatives.   Artificial sweeteners and fructose.   Digestive disorders, such as celiac disease and inflammatory bowel diseases.   Irritable bowel syndrome.  Some disorders of the pancreas.  Disorders of the thyroid.  Reduced blood flow to the intestines.  Cancer. Sometimes the cause of chronic diarrhea is unknown. RISK FACTORS  Having a severely weakened immune system, such as from HIV or AIDS.   Taking certain types of cancer-fighting drugs (such as with chemotherapy) or other medicines.   Having had a recent organ transplant.   Having a portion of the stomach or small bowel removed.   Traveling to countries where food and water supplies are often contaminated.  SYMPTOMS  In addition to frequent, loose stools, diarrhea may cause:   Cramping.   Abdominal pain.   Nausea.   Fever.  Fatigue.  Urgent need to use the bathroom.  Loss of bowel control. DIAGNOSIS  Your health care provider must take a careful  history and perform a physical exam. Tests given are based on your symptoms and history. Tests may include:   Blood or stool tests. Three or more stool samples may be examined. Stool cultures may be used to test for bacteria or parasites.   X-rays.   A procedure in which a thin tube is inserted into the mouth or rectum (endoscopy). This allows the health care provider to look  inside the intestine.  TREATMENT   Treatment is aimed at correcting the cause of the diarrhea when possible.  Diarrhea caused by an infection can often be treated with antibiotic medicines.  Diarrhea not caused by an infection may require you to take long-term medicine or have surgery. Specific treatment should be discussed with your health care provider.  If the cause cannot be determined, treatment aims to relieve symptoms and prevent dehydration. Serious health problems can occur if you do not maintain proper fluid levels. Treatment may include:  Taking an oral rehydration solution (ORS).  Not drinking beverages that contain caffeine (such as tea, coffee, and soft drinks).  Not drinking alcohol.  Maintaining well-balanced nutrition to help you recover faster. HOME CARE INSTRUCTIONS   Drink enough fluids to keep urine clear or pale yellow. Drink 1 cup (8 oz) of fluid for each diarrhea episode. Avoid fluids that contain simple sugars, fruit juices, whole milk products, and sodas. Hydrate with an ORS. You may purchase the ORS or prepare it at home by mixing the following ingredients together:   - tsp (1.7-3  mL) table salt.   tsp (3  mL) baking soda.   tsp (1.7 mL) salt substitute containing potassium chloride.  1 tbsp (20 mL) sugar.  4.2 c (1 L) of water.   Certain foods and beverages may increase the speed at which food moves through the gastrointestinal (GI) tract. These foods and beverages should be avoided. They include:  Caffeinated and alcoholic beverages.  High-fiber foods, such as raw  fruits and vegetables, nuts, seeds, and whole grain breads and cereals.  Foods and beverages sweetened with sugar alcohols, such as xylitol, sorbitol, and mannitol.   Some foods may be well tolerated and may help thicken stool. These include:  Starchy foods, such as rice, toast, pasta, low-sugar cereal, oatmeal, grits, baked potatoes, crackers, and bagels.  Bananas.  Applesauce.  Add probiotic-rich foods to help increase healthy bacteria in the GI tract. These include yogurt and fermented milk products.  Wash your hands well after each diarrhea episode.  Only take over-the-counter or prescription medicines as directed by your health care provider.  Take a warm bath to relieve any burning or pain from frequent diarrhea episodes. SEEK MEDICAL CARE IF:   You are not urinating as often.  Your urine is a dark color.  You become very tired or dizzy.  You have severe pain in the abdomen or rectum.  Your have blood or pus in your stools.  Your stools look black and tarry. SEEK IMMEDIATE MEDICAL CARE IF:   You are unable to keep fluids down.  You have persistent vomiting.  You have blood in your stool.  Your stools are black and tarry.  You do not urinate in 6-8 hours, or there is only a small amount of very dark urine.  You have abdominal pain that increases or localizes.  You have weakness, dizziness, confusion, or lightheadedness.  You have a severe headache.  Your diarrhea gets worse or does not get better.  You have a fever or persistent symptoms for more than 2-3 days.  You have a fever and your symptoms suddenly get worse. MAKE SURE YOU:   Understand these instructions.  Will watch your condition.  Will get help right away if you are not doing well or get worse.   This information is not intended to replace advice given to you by your health care provider. Make sure you discuss any questions you have with your health  care provider.   Document Released:  12/01/2003 Document Revised: 09/15/2013 Document Reviewed: 03/05/2013 Elsevier Interactive Patient Education [employer]

## 2016-04-27 ENCOUNTER — Encounter (HOSPITAL_BASED_OUTPATIENT_CLINIC_OR_DEPARTMENT_OTHER): Payer: Self-pay

## 2016-04-27 ENCOUNTER — Emergency Department (HOSPITAL_BASED_OUTPATIENT_CLINIC_OR_DEPARTMENT_OTHER): Payer: Self-pay

## 2016-04-27 ENCOUNTER — Emergency Department (HOSPITAL_BASED_OUTPATIENT_CLINIC_OR_DEPARTMENT_OTHER)
Admission: EM | Admit: 2016-04-27 | Discharge: 2016-04-27 | Disposition: A | Discharge status: Home / Self Care | Payer: Self-pay | Attending: Emergency Medicine | Admitting: Emergency Medicine

## 2016-04-27 DIAGNOSIS — K047 Periapical abscess without sinus: Secondary | ICD-10-CM | POA: Insufficient documentation

## 2016-04-27 DIAGNOSIS — H9209 Otalgia, unspecified ear: Secondary | ICD-10-CM | POA: Insufficient documentation

## 2016-04-27 DIAGNOSIS — N189 Chronic kidney disease, unspecified: Secondary | ICD-10-CM | POA: Insufficient documentation

## 2016-04-27 DIAGNOSIS — Z87891 Personal history of nicotine dependence: Secondary | ICD-10-CM | POA: Insufficient documentation

## 2016-04-27 LAB — CBC WITH DIFFERENTIAL/PLATELET
BASOS ABS: 0.1 10*3/uL (ref 0.0–0.1)
BASOS PCT: 1 %
EOS ABS: 0.2 10*3/uL (ref 0.0–0.7)
Eosinophils Relative: 2 %
HCT: 45.8 % (ref 36.0–46.0)
HEMOGLOBIN: 16.1 g/dL — AB (ref 12.0–15.0)
Lymphocytes Relative: 25 %
Lymphs Abs: 3.1 10*3/uL (ref 0.7–4.0)
MCH: 29.5 pg (ref 26.0–34.0)
MCHC: 35.2 g/dL (ref 30.0–36.0)
MCV: 84 fL (ref 78.0–100.0)
MONOS PCT: 9 %
Monocytes Absolute: 1.1 10*3/uL — ABNORMAL HIGH (ref 0.1–1.0)
NEUTROS PCT: 63 %
Neutro Abs: 8.1 10*3/uL — ABNORMAL HIGH (ref 1.7–7.7)
Platelets: 354 10*3/uL (ref 150–400)
RBC: 5.45 MIL/uL — ABNORMAL HIGH (ref 3.87–5.11)
RDW: 12.4 % (ref 11.5–15.5)
WBC: 12.6 10*3/uL — ABNORMAL HIGH (ref 4.0–10.5)

## 2016-04-27 LAB — BASIC METABOLIC PANEL
ANION GAP: 9 (ref 5–15)
BUN: 12 mg/dL (ref 6–20)
CALCIUM: 9.2 mg/dL (ref 8.9–10.3)
CHLORIDE: 102 mmol/L (ref 101–111)
CO2: 26 mmol/L (ref 22–32)
CREATININE: 0.83 mg/dL (ref 0.44–1.00)
GFR calc non Af Amer: >60 mL/min (ref >60)
Glucose, Bld: 106 mg/dL — ABNORMAL HIGH (ref 65–99)
Potassium: 3.9 mmol/L (ref 3.5–5.1)
SODIUM: 137 mmol/L (ref 135–145)

## 2016-04-27 LAB — PREGNANCY, URINE: PREG TEST UR: NEGATIVE

## 2016-04-27 MED ORDER — AMOXICILLIN-POT CLAVULANATE 875-125 MG PO TABS: 1.0000 | ORAL_TABLET | Freq: Two times a day (BID) | ORAL | 0 refills | Status: DC

## 2016-04-27 MED ORDER — IOPAMIDOL (ISOVUE-300) INJECTION 61%
80.0000 mL | Freq: Once | INTRAVENOUS | Status: AC | PRN
  Administered 2016-04-27: 80 mL via INTRAVENOUS

## 2016-04-27 NOTE — ED Triage Notes (Addendum)
C/o swelling to gums and face x this am-left ear ache x today-states she feels she has a dental abscess with hx of same-NAD-steady gait

## 2016-04-27 NOTE — Discharge Instructions (Signed)
Take antibiotics as prescribed. Return for worsening symptoms, including worsening swelling, fevers, difficulty breathing, difficulty wallowing saliva or any other symptoms concerning to you.

## 2016-04-27 NOTE — ED Provider Notes (Signed)
[facility]   CSN: [id number] Arrival date & time: 04/27/16  1703  First Provider Contact:   First MD Initiated Contact with Patient 04/27/16 1818     By signing my name below, I, Freida Busman, attest that this documentation has been prepared under the direction and in the presence of Lavera Guise, MD . Electronically Signed: Freida Busman, Scribe. 04/27/2016. 6:00 PM.  History   Chief Complaint Chief Complaint  Patient presents with  . Facial Swelling    The history is provided by the patient. No language interpreter was used.    HPI Comments:  Madison Mays is a 30 y.o. female who presents to the Emergency Department complaining of gradually worsening tenderness under her chin with associated right lower dental pain x 2 days. She also notes swelling to the chin are which began today and intermittent left ear pain. Pt reports having a "bad tooth" and abscess to the left side of mouth but notes she has not been able to take care of it for financial issues. She denies nausea, vomiting, difficulty swallowing, voice changes  and fever. No alleviating factors noted. No fever or chills.  Past Medical History:  Diagnosis Date  . Abnormal Pap smear 2009   COLPO; ;LAST PAP 2009  . Anemia    WITH PREGNANCY 2004  . Chronic kidney disease 2010   KIDNEY STONE  . H/O bladder infections   . H/O varicella   . Infection 02/2009   UTI  . Infection    YEAST X 1  . Infection 2009   PYELO WITH PREG  . Kidney infection   . 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 Varicose veins 2008  . Yeast infection     Patient Active Problem List   Diagnosis Date Noted  . Vaginal delivery 09/21/2012    Past Surgical History:  Procedure Laterality Date  . NO PAST SURGERIES    . TUBAL LIGATION  09/22/2012   Procedure: POST PARTUM TUBAL LIGATION;  Surgeon: Kirkland Hun, MD;  Location: [facility];  Service: Gynecology;   Laterality: Bilateral;    OB History    Gravida Para Term Preterm AB Living   0 0 4   SAB TAB Ectopic Multiple Live Births   0 0 0 0 4       Home Medications    Prior to Admission medications   Not on File    Family History Family History  Problem Relation Age of Onset  . Other Mother     VARICOSE VEINS  . Diabetes Father   . Hypertension Father   . Cancer Maternal Grandfather     LUNG  . Diabetes Paternal Grandmother   . Cancer Paternal Grandfather     LUNG  . Other Son     ARTHROGRYPOSIS  . Vision loss Son     DUANE'S SYNDROME  . Anesthesia problems Neg Hx     Social History Social History  Substance Use Topics  . Smoking status: Former Smoker    Quit date: 09/24/2005  . Smokeless tobacco: Never Used  . Alcohol use No     Allergies   Review of patient's allergies indicates no known allergies.   Review of Systems Review of Systems  Constitutional: Negative for fever.  HENT: Positive for dental problem, ear pain and facial swelling. Negative for trouble swallowing and voice change.   Gastrointestinal: Negative  for nausea.   Physical Exam Updated Vital Signs BP 123/86 (BP Location: Left Arm)   Pulse (!) 126   Temp 98.3 F (36.8 C) (Oral)   Resp 22   Ht 5\' 4"  (1.626 m)   Wt 230 lb (104.3 kg)   LMP 04/07/2016   SpO2 95%   BMI 39.48 kg/m   Physical Exam Nursing note and vitals reviewed. Constitutional: Well developed, well nourished, non-toxic, and in no acute distress Head: Normocephalic and atraumatic.  Mouth/Throat: Oropharynx is clear and moist. Minimal submandibular fullness externally; no significant submandicular swelling intraorally; poor denti with mult cavities fractured tooth #24, no sig ginigival swelling  Ears: Normal TMs bilaterally  Neck: Normal range of motion. Neck supple.  Cardiovascular: Normal rate and regular rhythm.   Pulmonary/Chest: Effort normal and breath sounds normal.  Abdominal: Soft. There is no tenderness.  There is no rebound and no guarding.  Musculoskeletal: Normal range of motion.  Neurological: Alert, no facial droop, fluent speech, moves all extremities symmetrically Skin: Skin is warm and dry.  Psychiatric: Cooperative   ED Treatments / Results  DIAGNOSTIC STUDIES:  Oxygen Saturation is 95% on RA, adequate by my interpretation.    COORDINATION OF CARE:  6:22 PM  Discussed treatment plan with pt at bedside and pt agreed to plan.  Labs (all labs ordered are listed, but only abnormal results are displayed) Labs Reviewed - No data to display  EKG  EKG Interpretation None       Radiology No results found.  Procedures Procedures   Medications Ordered in ED Medications - No data to display   Initial Impression / Assessment and Plan / ED Course  I have reviewed the triage vital signs and the nursing notes.  Pertinent labs & imaging results that were available during my care of the patient were reviewed by me and considered in my medical decision making (see chart for details).  Clinical Course    Presenting with dental pain and facial swelling over the past 2 days. I he is afebrile and well-appearing, initially noted to be tachycardic with heart rate in the 120s, but on my repeat exam she is not Tachycardic and does not appear systemically ill. Basic blood work with mild leukocytosis of 12. There is mild submandibular fullness on external exam but no deviation of the tongue or fullness of the submandibular space intraorally. She does have fractured tooth #24 with some associating gingival swelling. I did perform CT soft tissue of the neck given question of early submandibular infection. This shows no abscess and no deep space soft tissue neck infection. There is evidence of [address]. dental inflammation and infection. Is started on Augmentin and given resources for dental follow-up. Strict return and follow-up instructions are reviewed. She expressed understanding of all  discharge instructions, and felt comfortable with the plan of care.  Final Clinical Impressions(s) / ED Diagnoses   Final diagnoses:  None    New Prescriptions New Prescriptions   No medications on file   I personally performed the services described in this documentation, which was scribed in my presence. The recorded information has been reviewed and is accurate.    Lavera Guise, MD 04/27/16 2002

## 2016-10-27 ENCOUNTER — Encounter (HOSPITAL_BASED_OUTPATIENT_CLINIC_OR_DEPARTMENT_OTHER): Payer: Self-pay | Admitting: *Deleted

## 2016-10-27 ENCOUNTER — Emergency Department (HOSPITAL_BASED_OUTPATIENT_CLINIC_OR_DEPARTMENT_OTHER)
Admission: EM | Admit: 2016-10-27 | Discharge: 2016-10-27 | Disposition: A | Discharge status: Home / Self Care | Payer: Medicaid Other | Attending: Emergency Medicine | Admitting: Emergency Medicine

## 2016-10-27 DIAGNOSIS — Z87891 Personal history of nicotine dependence: Secondary | ICD-10-CM | POA: Insufficient documentation

## 2016-10-27 DIAGNOSIS — K029 Dental caries, unspecified: Secondary | ICD-10-CM | POA: Insufficient documentation

## 2016-10-27 HISTORY — DX: Personal history of other diseases of the digestive system: Z87.19

## 2016-10-27 MED ORDER — HYDROCODONE-ACETAMINOPHEN 5-325 MG PO TABS: 1.0000 | ORAL_TABLET | Freq: Four times a day (QID) | ORAL | 0 refills | Status: AC | PRN

## 2016-10-27 MED ORDER — PENICILLIN V POTASSIUM 500 MG PO TABS
500.0000 mg | ORAL_TABLET | Freq: Three times a day (TID) | ORAL | 0 refills | Status: AC
Start: 2016-10-27 — End: ?

## 2016-10-27 NOTE — ED Triage Notes (Addendum)
Pt reports dental pain in lower front teeth x2days; reports hx of abscess in same area. Denies fever, n/v/d, drainage from area. Reports taking 2 tabs OTC Motrin 2-3 hrs PTA.

## 2016-10-27 NOTE — ED Provider Notes (Signed)
[facility] Provider Note   CSN: [id number] Arrival date & time: 10/27/16  0840     History   Chief Complaint Chief Complaint  Patient presents with  . Dental Pain    HPI Madison Mays is a 31 y.o. female.  Patient is a 31 year old female with no significant past medical history. She presents for evaluation of dental pain. Reports having had an abscess in the past. She has been developing more pain and discomfort to her lower incisors. She has several decayed teeth in this area. She denies any fevers or chills. She denies any difficulty breathing or swallowing.   The history is provided by the patient.  Dental Pain   This is a new problem. The current episode started 2 days ago. The problem occurs constantly. The problem has been gradually worsening. The pain is moderate. Treatments tried: Ibuprofen. The treatment provided mild relief.    Past Medical History:  Diagnosis Date  . Abnormal Pap smear 2009   COLPO; ;LAST PAP 2009  . Anemia    WITH PREGNANCY 2004  . Chronic kidney disease 2010   KIDNEY STONE  . H/O bladder infections   . H/O varicella   . History of dental abscess   . Infection 02/2009   UTI  . Infection    YEAST X 1  . Infection 2009   PYELO WITH PREG  . Kidney infection   . 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 Varicose veins 2008  . Yeast infection     Patient Active Problem List   Diagnosis Date Noted  . Vaginal delivery 09/21/2012    Past Surgical History:  Procedure Laterality Date  . NO PAST SURGERIES    . TUBAL LIGATION  09/22/2012   Procedure: POST PARTUM TUBAL LIGATION;  Surgeon: Kirkland Hun, MD;  Location: [facility];  Service: Gynecology;  Laterality: Bilateral;    OB History    Gravida Para Term Preterm AB Living   4 4 4  0 0 4   SAB TAB Ectopic Multiple Live Births   0 0 0 0 4       Home Medications    Prior to Admission medications   Medication  Sig Start Date End Date Taking? Authorizing Provider  amoxicillin-clavulanate (AUGMENTIN) 875-125 MG tablet Take 1 tablet by mouth every 12 (twelve) hours. 04/27/16   Lavera Guise, MD    Family History Family History  Problem Relation Age of Onset  . Other Mother     VARICOSE VEINS  . Diabetes Father   . Hypertension Father   . Cancer Maternal Grandfather     LUNG  . Diabetes Paternal Grandmother   . Cancer Paternal Grandfather     LUNG  . Other Son     ARTHROGRYPOSIS  . Vision loss Son     DUANE'S SYNDROME  . Anesthesia problems Neg Hx     Social History Social History  Substance Use Topics  . Smoking status: Former Smoker    Quit date: 09/24/2005  . Smokeless tobacco: Never Used  . Alcohol use No     Allergies   Patient has no known allergies.   Review of Systems Review of Systems  All other systems reviewed and are negative.    Physical Exam Updated Vital Signs BP 121/86 (BP Location: Left Arm)   Pulse 106   Temp 98.1 F (36.7 C) (Oral)   Resp 18  Ht 5\' 4"  (1.626 m)   Wt 255 lb (115.7 kg)   LMP 10/06/2016 (Approximate)   SpO2 100%   BMI 43.77 kg/m   Physical Exam  Constitutional: She is oriented to person, place, and time. She appears well-developed and well-nourished. No distress.  HENT:  Head: Normocephalic and atraumatic.  Mouth/Throat: Oropharynx is clear and moist.  There are several decayed incisors to the lower jaw. There is surrounding gingival inflammation, however no obvious swelling or evidence for drainable abscess. There is no swelling or crepitus of the submental space. There is no stridor.  Neck: Normal range of motion. Neck supple.  Pulmonary/Chest: Effort normal.  Lymphadenopathy:    She has no cervical adenopathy.  Neurological: She is alert and oriented to person, place, and time.  Skin: Skin is warm and dry. She is not diaphoretic.  Nursing note and vitals reviewed.    ED Treatments / Results  Labs (all labs ordered are  listed, but only abnormal results are displayed) Labs Reviewed - No data to display  EKG  EKG Interpretation None       Radiology No results found.  Procedures Procedures (including critical care time)  Medications Ordered in ED Medications - No data to display   Initial Impression / Assessment and Plan / ED Course  I have reviewed the triage vital signs and the nursing notes.  Pertinent labs & imaging results that were available during my care of the patient were reviewed by me and considered in my medical decision making (see chart for details).     We'll treat with penicillin, pain medication, and follow-up with dentistry.  Final Clinical Impressions(s) / ED Diagnoses   Final diagnoses:  None    New Prescriptions New Prescriptions   No medications on file     Geoffery Lyonsouglas Odies Desa, MD 10/27/16 [phone]

## 2016-10-27 NOTE — Discharge Instructions (Signed)
Penicillin as prescribed.  Hydrocodone as prescribed as needed for pain.  Follow-up with dentistry in the next week, and return to the ER if your symptoms significantly worsen or change.

## 2016-10-27 NOTE — ED Notes (Signed)
ED Provider at bedside. 

## 2016-10-28 ENCOUNTER — Emergency Department (HOSPITAL_BASED_OUTPATIENT_CLINIC_OR_DEPARTMENT_OTHER)
Admission: EM | Admit: 2016-10-28 | Discharge: 2016-10-28 | Disposition: A | Discharge status: Home / Self Care | Payer: Medicaid Other | Attending: Emergency Medicine | Admitting: Emergency Medicine

## 2016-10-28 ENCOUNTER — Encounter (HOSPITAL_BASED_OUTPATIENT_CLINIC_OR_DEPARTMENT_OTHER): Payer: Self-pay | Admitting: *Deleted

## 2016-10-28 DIAGNOSIS — Z79899 Other long term (current) drug therapy: Secondary | ICD-10-CM | POA: Insufficient documentation

## 2016-10-28 DIAGNOSIS — Z87891 Personal history of nicotine dependence: Secondary | ICD-10-CM | POA: Insufficient documentation

## 2016-10-28 DIAGNOSIS — K0889 Other specified disorders of teeth and supporting structures: Secondary | ICD-10-CM

## 2016-10-28 DIAGNOSIS — N189 Chronic kidney disease, unspecified: Secondary | ICD-10-CM | POA: Insufficient documentation

## 2016-10-28 MED ORDER — LIDOCAINE VISCOUS 2 % MT SOLN: 15.0000 mL | OROMUCOSAL | 2 refills | Status: AC | PRN

## 2016-10-28 MED ORDER — IBUPROFEN 800 MG PO TABS: 800.0000 mg | ORAL_TABLET | Freq: Three times a day (TID) | ORAL | 0 refills | Status: AC

## 2016-10-28 MED ORDER — KETOROLAC TROMETHAMINE 60 MG/2ML IM SOLN
60.0000 mg | Freq: Once | INTRAMUSCULAR | Status: AC
  Administered 2016-10-28: 60 mg via INTRAMUSCULAR
  Filled 2016-10-28: qty 2

## 2016-10-28 NOTE — Discharge Instructions (Signed)
You have been seen today for dental pain. You should follow up with a dentist as soon as possible. This problem will not resolve on its own without the care of a dentist. Use ibuprofen or naproxen for pain. You may take up to 500 mg of naproxen every 12 hours or 800 mg of ibuprofen every 8 hours. Take these medications with food to avoid upset stomach. Use the viscous lidocaine for mouth pain. Swish with the lidocaine and spit it out. Do not swallow it. Continue to take the antibiotic until gone.  Dental Resource Guide  [facility] [address], [address] [place], [place] [address] [phone]  [facility] [place] [address] [place], [place] [address] [phone]  [facility] [address] [place], [place] [address] [phone]. [phone]  [facility] [address] [place], [place] [address] [phone]) [phone]  [facility] [address] [place], [place] [address] [phone]  [employer] [email or web address]  [employer] [address] [place], [place] [address] [phone]  Website for free, low-income, or sliding scale dental services in [place]: www.freedental.us  To find a dentist in [place] and surrounding areas: [email or web address]-the-public/find-[email or web address]-dentist  [facility] [employer]athttp://[email or web address]/meetings-events/[email or web address]-missions-[email or web address]-mercy  [facility] Medicaid Dentist http://[email or web address]://dma.[email or web address]/find-[email or web address]/[email or web address]-[email or web address]-[email or web address]

## 2016-10-28 NOTE — ED Provider Notes (Signed)
[facility] Provider Note   CSN: [id number] Arrival date & time: 10/28/16  1011     History   Chief Complaint Chief Complaint  Patient presents with  . Dental Pain    HPI Madison Mays is a 31 y.o. female.  HPI    Madison Mays is a 31 y.o. female, with a history of dental pain, presenting to the ED with lower anterior dental pain that has been going on for the past three days. She was seen here in the ED yesterday and prescribed PCN and Norco. Was advised to follow up with dentistry. She returns today because she states she was told to return should anything worsen. Her pain has worsened. She called here to the ED last night and left a message. She spoke to a nurse this morning who told her to come back in because she might need a new antibiotic. She denies fever/chills, N/V, difficulty breathing/swallowing, or any other complaints.      Past Medical History:  Diagnosis Date  . Abnormal Pap smear 2009   COLPO; ;LAST PAP 2009  . Anemia    WITH PREGNANCY 2004  . Chronic kidney disease 2010   KIDNEY STONE  . H/O bladder infections   . H/O varicella   . History of dental abscess   . Infection 02/2009   UTI  . Infection    YEAST X 1  . Infection 2009   PYELO WITH PREG  . Kidney infection   . 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 Varicose veins 2008  . Yeast infection     Patient Active Problem List   Diagnosis Date Noted  . Vaginal delivery 09/21/2012    Past Surgical History:  Procedure Laterality Date  . NO PAST SURGERIES    . TUBAL LIGATION  09/22/2012   Procedure: POST PARTUM TUBAL LIGATION;  Surgeon: Kirkland Hun, MD;  Location: [facility];  Service: Gynecology;  Laterality: Bilateral;    OB History    Gravida Para Term Preterm AB Living   4 4 4  0 0 4   SAB TAB Ectopic Multiple Live Births   0 0 0 0 4       Home Medications    Prior to Admission medications   Medication  Sig Start Date End Date Taking? Authorizing Provider  HYDROcodone-acetaminophen (NORCO) 5-325 MG tablet Take 1-2 tablets by mouth every 6 (six) hours as needed. 10/27/16  Yes Geoffery Lyons, MD  penicillin v potassium (VEETID) 500 MG tablet Take 1 tablet (500 mg total) by mouth 3 (three) times daily. 10/27/16  Yes Geoffery Lyons, MD  ibuprofen (ADVIL,MOTRIN) 800 MG tablet Take 1 tablet (800 mg total) by mouth 3 (three) times daily. 10/28/16   Shawn C Joy, PA-C  lidocaine (XYLOCAINE) 2 % solution Use as directed 15 mLs in the mouth or throat as needed for mouth pain. 10/28/16   Anselm Pancoast, PA-C    Family History Family History  Problem Relation Age of Onset  . Other Mother     VARICOSE VEINS  . Diabetes Father   . Hypertension Father   . Cancer Maternal Grandfather     LUNG  . Diabetes Paternal Grandmother   . Cancer Paternal Grandfather     LUNG  . Other Son     ARTHROGRYPOSIS  . Vision loss Son     DUANE'S SYNDROME  . Anesthesia problems Neg Hx  Social History Social History  Substance Use Topics  . Smoking status: Former Smoker    Quit date: 09/24/2005  . Smokeless tobacco: Never Used  . Alcohol use No     Allergies   Patient has no known allergies.   Review of Systems Review of Systems  Constitutional: Negative for chills and fever.  HENT: Positive for dental problem. Negative for drooling, facial swelling, trouble swallowing and voice change.   Respiratory: Negative for shortness of breath.   Gastrointestinal: Negative for nausea and vomiting.     Physical Exam Updated Vital Signs BP 129/74 (BP Location: Right Arm)   Pulse 111   Temp 98.5 F (36.9 C) (Oral)   Resp 20   Ht 5\' 4"  (1.626 m)   Wt 115.7 kg   LMP 10/06/2016 (Approximate)   SpO2 97%   BMI 43.77 kg/m   Physical Exam  Constitutional: She appears well-developed and well-nourished. No distress.  HENT:  Head: Normocephalic and atraumatic.  Tenderness to the left lower medial incisor and surrounding  gingiva without noted instability of the tooth. No noted swelling or fluctuance to suggest an abscess. Patient readily handles oral secretions without difficulty. No trismus.  Eyes: Conjunctivae are normal.  Neck: Normal range of motion. Neck supple.  Cardiovascular: Normal rate and regular rhythm.   Pulmonary/Chest: Effort normal.  Lymphadenopathy:    She has no cervical adenopathy.  Neurological: She is alert.  Skin: Skin is warm and dry. She is not diaphoretic.  Psychiatric: She has a normal mood and affect. Her behavior is normal.  Nursing note and vitals reviewed.    ED Treatments / Results  Labs (all labs ordered are listed, but only abnormal results are displayed) Labs Reviewed - No data to display  EKG  EKG Interpretation None       Radiology No results found.  Procedures Procedures (including critical care time)  Medications Ordered in ED Medications  ketorolac (TORADOL) injection 60 mg (60 mg Intramuscular Given 10/28/16 1041)     Initial Impression / Assessment and Plan / ED Course  I have reviewed the triage vital signs and the nursing notes.  Pertinent labs & imaging results that were available during my care of the patient were reviewed by me and considered in my medical decision making (see chart for details).      Patient returns again for dental pain. No signs of sepsis or Ludwig's angioedema. No signs of an abscess that would require bedside drainage. Time was spent counseling the patient on expectations. She was offered a dental block with explanation, but declined. Detailed return precautions discussed. Patient voices understanding of all instructions and is comfortable with discharge.   Vitals:   10/28/16 1019 10/28/16 1020  BP: 129/74   Pulse: 111   Resp: 20   Temp: 98.5 F (36.9 C)   TempSrc: Oral   SpO2: 97%   Weight:  115.7 kg  Height:  5\' 4"  (1.626 m)  Patient was not tachycardic upon my evaluation.    Final Clinical  Impressions(s) / ED Diagnoses   Final diagnoses:  Pain, dental    New Prescriptions Discharge Medication List as of 10/28/2016 10:49 AM    START taking these medications   Details  ibuprofen (ADVIL,MOTRIN) 800 MG tablet Take 1 tablet (800 mg total) by mouth 3 (three) times daily., Starting Sun 10/28/2016, Print    lidocaine (XYLOCAINE) 2 % solution Use as directed 15 mLs in the mouth or throat as needed for mouth pain., Starting Sun  10/28/2016, Print         Anselm Pancoast, PA-C 10/28/16 1106    Geoffery Lyons, MD 10/28/16 [phone]

## 2016-10-28 NOTE — ED Triage Notes (Signed)
Pt seen here yesterday for the same; reports pain med has been effective but that pain has gotten worse in terms of tenderness. Also reports slight swelling.

## 2016-10-28 NOTE — ED Notes (Signed)
ED Provider at bedside. 

## 2017-09-24 IMAGING — CT CT NECK W/ CM
4 of 5 series · 15 of 33 positions shown, 18 images · IV contrast (iopamidol)
Comparison: None.

CLINICAL DATA: Dental pain.  Submandibular swelling for 1-2 days.

EXAM:
CT NECK WITH CONTRAST
TECHNIQUE: Multidetector CT imaging of the neck was performed using the
standard protocol following the bolus administration of intravenous
contrast.
CONTRAST:  80mL [id number] IOPAMIDOL ([id number]) INJECTION 61%

[Series 2: axial neck · axial · 0.45mm/px · z∈[-247,-195]mm · 2 of 105 slices shown]
[im 27/105  bone]
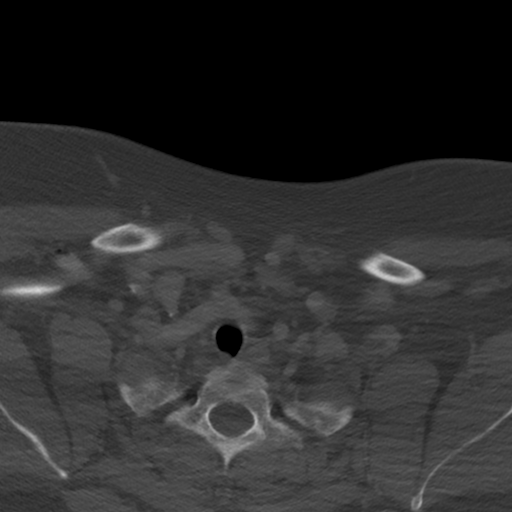
[im 53/105  bone]
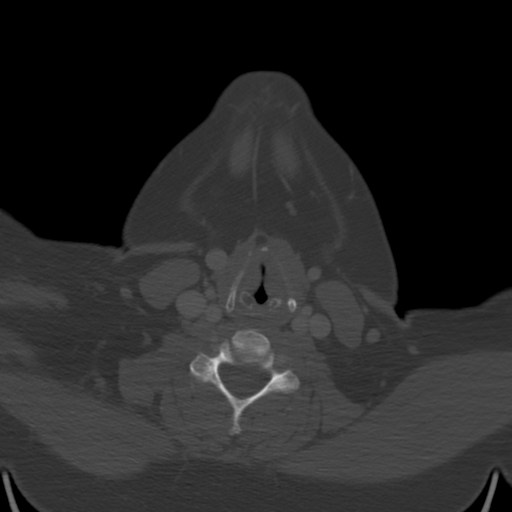

[Series 5: sag neck · sagittal · 0.48mm/px · 5 of 101 slices shown, 6 images]
[im 34/101  bone]
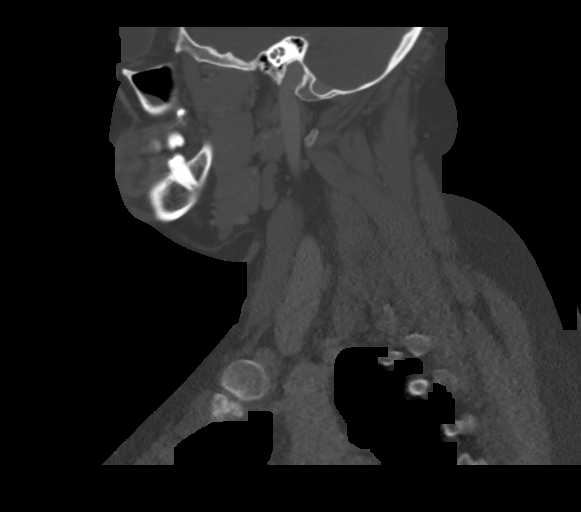
[im 42/101  bone]
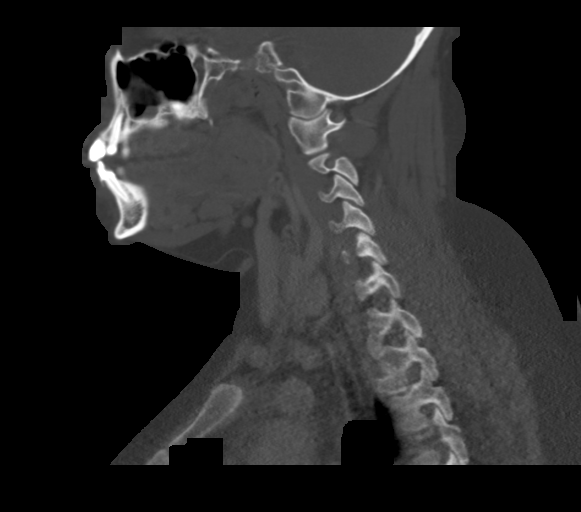
[im 51/101  soft-tissue]
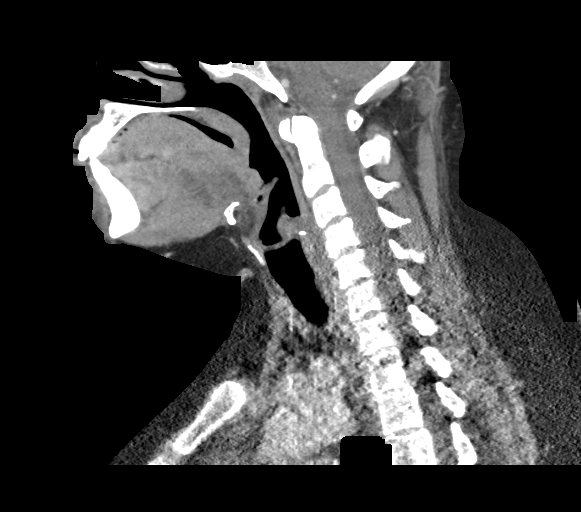
[im 51/101  bone]
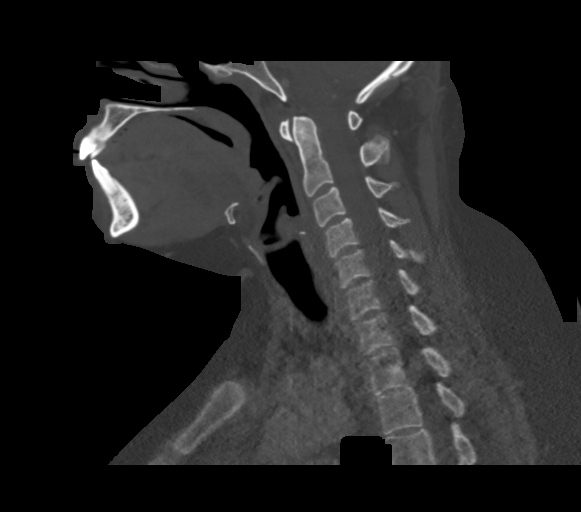
[im 59/101  bone]
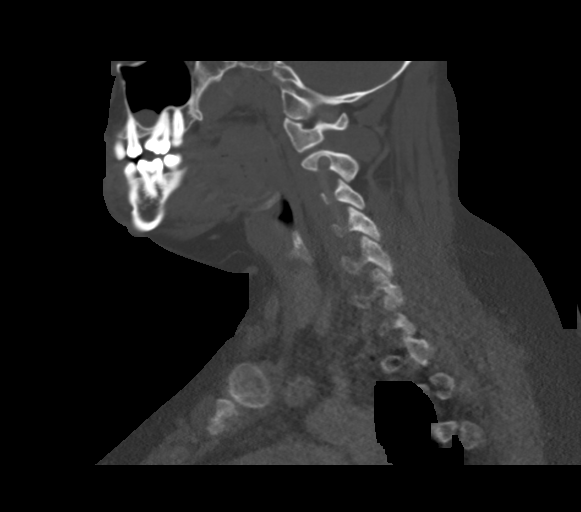
[im 67/101  bone]
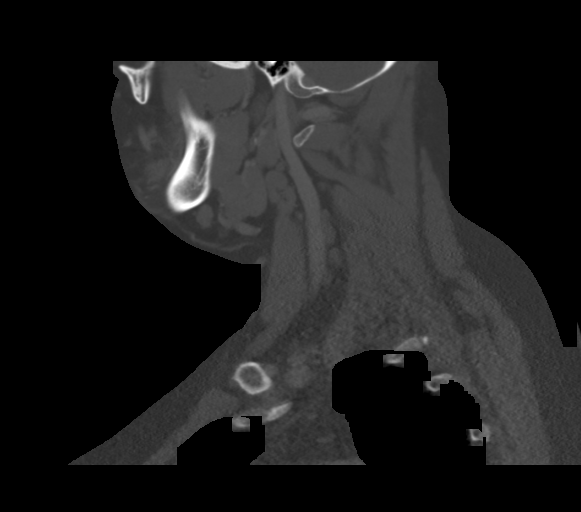

[Series 6: cor neck · coronal · 0.41mm/px · 3 of 91 slices shown]
[im 19/91  bone]
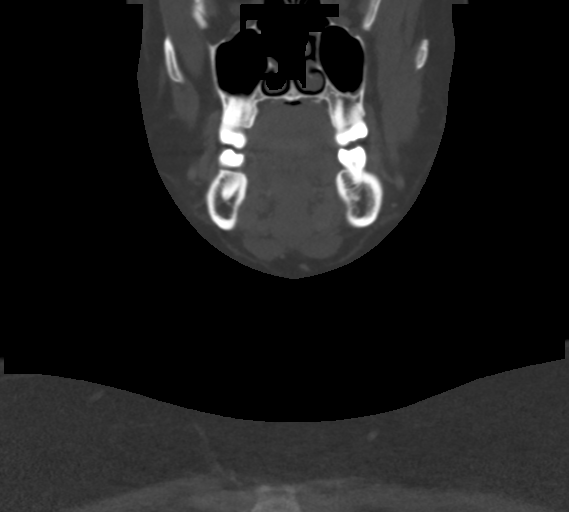
[im 37/91  bone]
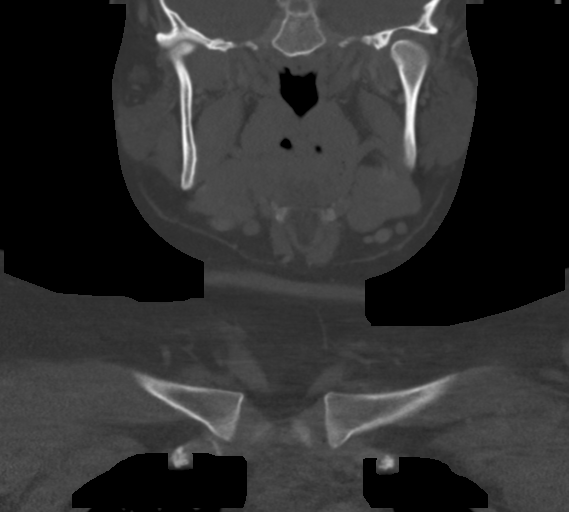
[im 55/91  bone]
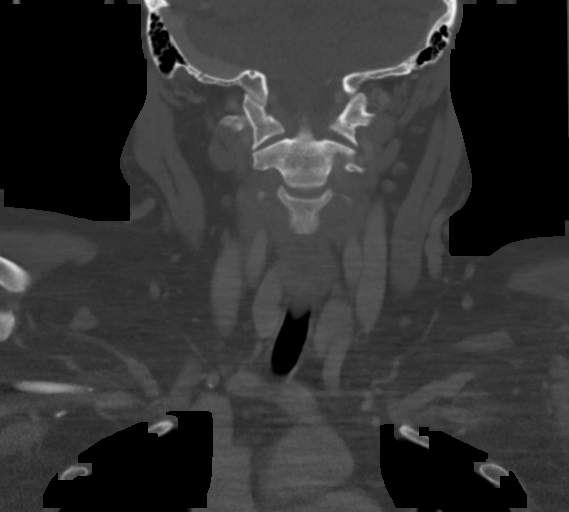

[Series 7: orthogonal ax · axial · 0.39mm/px · z∈[-302,-133]mm · 5 of 131 slices shown, 7 images]
[im 22/131  soft-tissue]
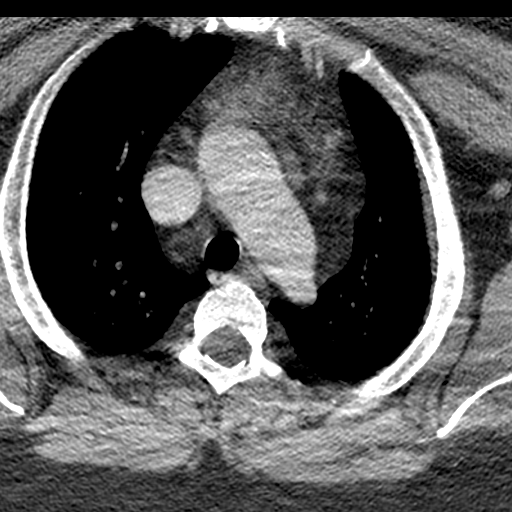
[im 22/131  bone]
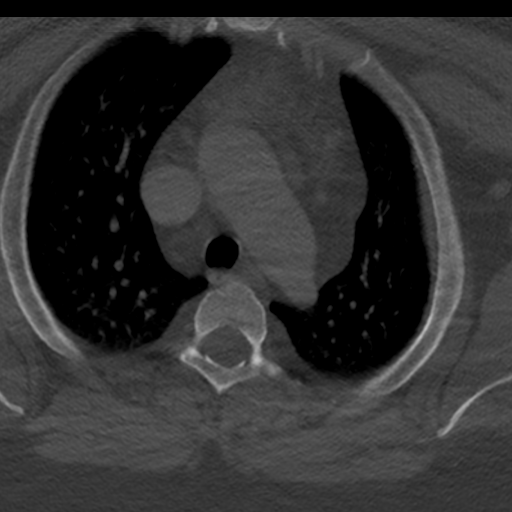
[im 44/131  bone]
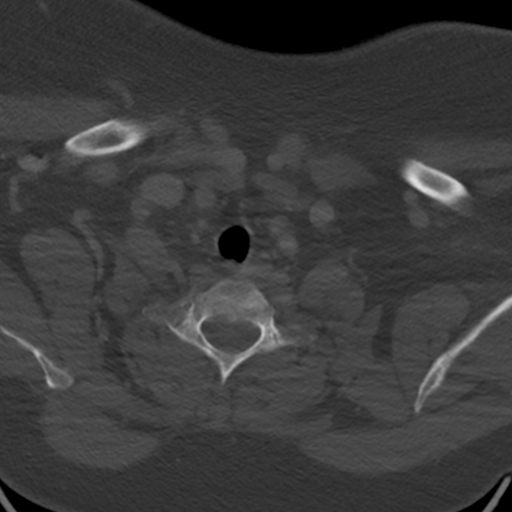
[im 66/131  bone]
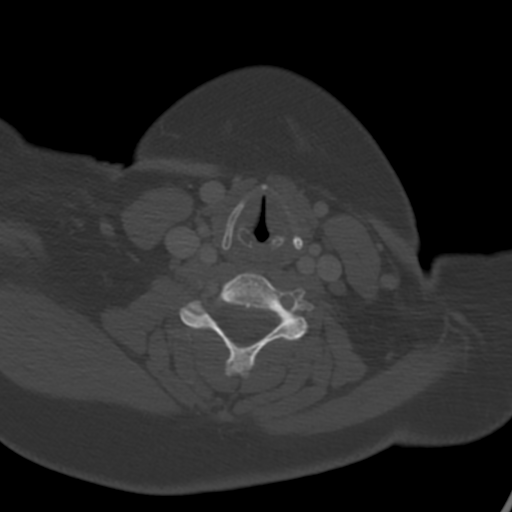
[im 87/131  bone]
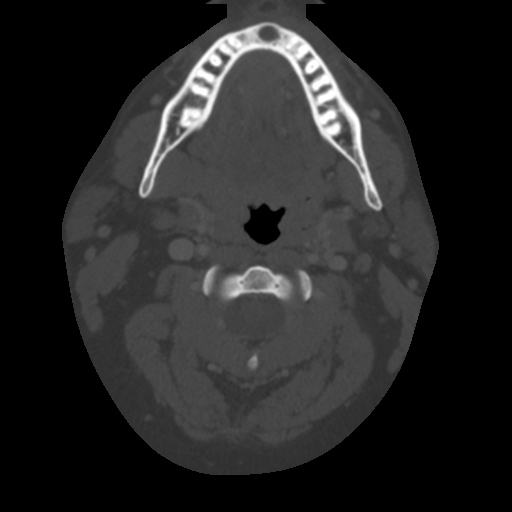
[im 109/131  soft-tissue]
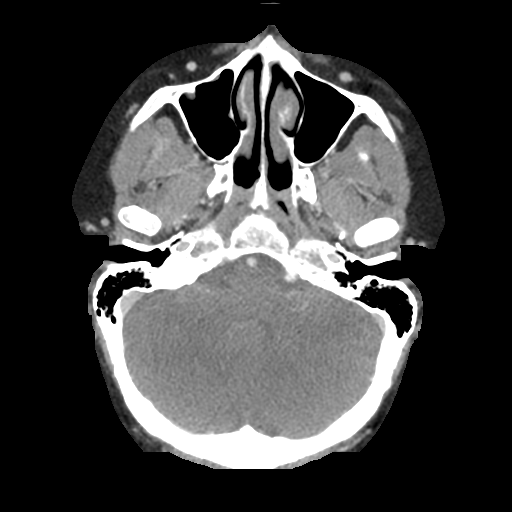
[im 109/131  bone]
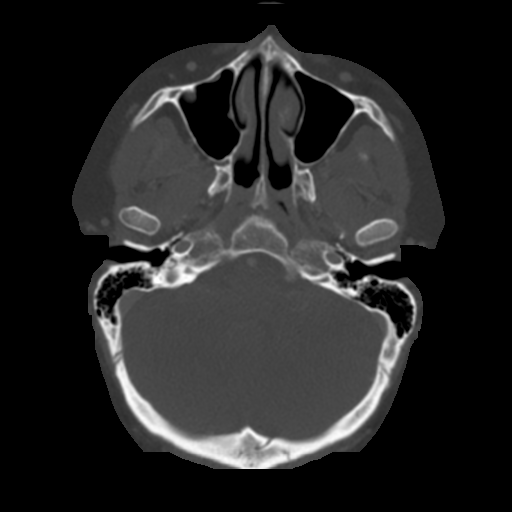

[15 of 33 positions shown; findings below may reference images not displayed]

FINDINGS: Pharynx and larynx: There is a 9 mm periapical lucency in the
mandible associated with the central incisors and left lateral
incisor. There is mild soft tissue stranding anterior to the
mandible without fluid collection identified. There is an impacted
right maxillary wisdom tooth. Pharynx and larynx are unremarkable.

Salivary glands: Submandibular and parotid glands are unremarkable.

Thyroid: Unremarkable.

Lymph nodes: There is an increased number of small level I and II
lymph nodes bilaterally, likely reactive. The largest node measures
11 mm in short axis on the left in level IIA. Submandibular lymph
nodes are all subcentimeter in short axis.

Vascular: Major vascular structures of the neck appear patent.

Limited intracranial: Unremarkable.

Visualized orbits: Unremarkable.

Mastoids and visualized paranasal sinuses: Mildly polypoid mucosal
thickening inferiorly in the maxillary sinuses. Clear mastoid air
cells.

Skeleton: Unremarkable appearance of the cervical spine.

Upper chest: Clear lung apices.
IMPRESSION: Periapical lucency associated with the mandibular incisors
consistent with periodontal infection. Mild adjacent soft tissue
inflammation without abscess.
